# Patient Record
Sex: Male | Born: 1955 | Race: White | Hispanic: No | Marital: Married | Smoking: Never smoker
Health system: Southern US, Community
[De-identification: ages and names within clinical notes are randomized; demographics above are authoritative.]

## PROBLEM LIST (undated history)

## (undated) DIAGNOSIS — E785 Hyperlipidemia, unspecified: Secondary | ICD-10-CM

## (undated) DIAGNOSIS — E669 Obesity, unspecified: Secondary | ICD-10-CM

## (undated) DIAGNOSIS — K649 Unspecified hemorrhoids: Secondary | ICD-10-CM

## (undated) DIAGNOSIS — K219 Gastro-esophageal reflux disease without esophagitis: Secondary | ICD-10-CM

## (undated) DIAGNOSIS — M1990 Unspecified osteoarthritis, unspecified site: Secondary | ICD-10-CM

## (undated) DIAGNOSIS — J309 Allergic rhinitis, unspecified: Secondary | ICD-10-CM

## (undated) DIAGNOSIS — M545 Low back pain: Secondary | ICD-10-CM

## (undated) DIAGNOSIS — R079 Chest pain, unspecified: Secondary | ICD-10-CM

## (undated) HISTORY — DX: Unspecified osteoarthritis, unspecified site: M19.90

## (undated) HISTORY — PX: OTHER SURGICAL HISTORY: SHX169

## (undated) HISTORY — DX: Unspecified hemorrhoids: K64.9

## (undated) HISTORY — PX: VASECTOMY: SHX75

## (undated) HISTORY — DX: Allergic rhinitis, unspecified: J30.9

## (undated) HISTORY — DX: Chest pain, unspecified: R07.9

## (undated) HISTORY — DX: Gastro-esophageal reflux disease without esophagitis: K21.9

## (undated) HISTORY — DX: Low back pain: M54.5

## (undated) HISTORY — DX: Hyperlipidemia, unspecified: E78.5

## (undated) HISTORY — DX: Obesity, unspecified: E66.9

---

## 2001-08-08 ENCOUNTER — Encounter: Admission: RE | Admit: 2001-08-08 | Discharge: 2001-08-08 | Payer: Self-pay | Admitting: *Deleted

## 2001-08-08 ENCOUNTER — Encounter: Payer: Self-pay | Admitting: *Deleted

## 2001-08-29 ENCOUNTER — Encounter: Admission: RE | Admit: 2001-08-29 | Discharge: 2001-08-29 | Payer: Self-pay | Admitting: *Deleted

## 2001-08-29 ENCOUNTER — Encounter: Payer: Self-pay | Admitting: *Deleted

## 2002-03-02 ENCOUNTER — Encounter: Admission: RE | Admit: 2002-03-02 | Discharge: 2002-03-02 | Payer: Self-pay | Admitting: *Deleted

## 2002-03-02 ENCOUNTER — Encounter: Payer: Self-pay | Admitting: *Deleted

## 2003-11-28 ENCOUNTER — Encounter: Admission: RE | Admit: 2003-11-28 | Discharge: 2003-11-28 | Payer: Self-pay | Admitting: Internal Medicine

## 2003-12-09 ENCOUNTER — Ambulatory Visit (HOSPITAL_COMMUNITY): Admission: RE | Admit: 2003-12-09 | Discharge: 2003-12-09 | Payer: Self-pay | Admitting: Orthopedic Surgery

## 2003-12-09 ENCOUNTER — Ambulatory Visit (HOSPITAL_BASED_OUTPATIENT_CLINIC_OR_DEPARTMENT_OTHER): Admission: RE | Admit: 2003-12-09 | Discharge: 2003-12-09 | Payer: Self-pay | Admitting: Orthopedic Surgery

## 2004-05-05 ENCOUNTER — Ambulatory Visit: Payer: Self-pay | Admitting: Internal Medicine

## 2004-05-16 ENCOUNTER — Ambulatory Visit (HOSPITAL_COMMUNITY): Admission: RE | Admit: 2004-05-16 | Discharge: 2004-05-16 | Payer: Self-pay | Admitting: Internal Medicine

## 2004-06-13 ENCOUNTER — Ambulatory Visit: Payer: Self-pay | Admitting: Internal Medicine

## 2004-06-14 ENCOUNTER — Encounter: Admission: RE | Admit: 2004-06-14 | Discharge: 2004-06-14 | Payer: Self-pay | Admitting: Internal Medicine

## 2005-01-05 ENCOUNTER — Ambulatory Visit: Payer: Self-pay | Admitting: Internal Medicine

## 2005-01-12 ENCOUNTER — Ambulatory Visit: Payer: Self-pay | Admitting: Internal Medicine

## 2005-01-17 ENCOUNTER — Ambulatory Visit: Payer: Self-pay | Admitting: Internal Medicine

## 2005-04-06 ENCOUNTER — Ambulatory Visit: Payer: Self-pay | Admitting: Internal Medicine

## 2005-04-09 ENCOUNTER — Ambulatory Visit: Payer: Self-pay | Admitting: Internal Medicine

## 2005-06-01 ENCOUNTER — Ambulatory Visit: Payer: Self-pay | Admitting: Internal Medicine

## 2005-06-12 ENCOUNTER — Ambulatory Visit: Payer: Self-pay | Admitting: Internal Medicine

## 2005-06-15 ENCOUNTER — Ambulatory Visit: Payer: Self-pay | Admitting: Internal Medicine

## 2005-10-19 ENCOUNTER — Ambulatory Visit: Payer: Self-pay | Admitting: Internal Medicine

## 2006-07-12 ENCOUNTER — Ambulatory Visit (HOSPITAL_BASED_OUTPATIENT_CLINIC_OR_DEPARTMENT_OTHER): Admission: RE | Admit: 2006-07-12 | Discharge: 2006-07-12 | Payer: Self-pay | Admitting: Specialist

## 2006-11-22 ENCOUNTER — Ambulatory Visit: Payer: Self-pay | Admitting: Internal Medicine

## 2006-11-22 LAB — CONVERTED CEMR LAB
ALT: 70 units/L — ABNORMAL HIGH (ref 0–53)
Albumin: 3.8 g/dL (ref 3.5–5.2)
Alkaline Phosphatase: 70 units/L (ref 39–117)
BUN: 12 mg/dL (ref 6–23)
Bilirubin Urine: NEGATIVE
Bilirubin, Direct: 0.1 mg/dL (ref 0.0–0.3)
Calcium: 8.9 mg/dL (ref 8.4–10.5)
Chloride: 109 meq/L (ref 96–112)
Creatinine, Ser: 0.9 mg/dL (ref 0.4–1.5)
GFR calc non Af Amer: 95 mL/min
HDL: 41.5 mg/dL (ref >39.0)
Hemoglobin: 14 g/dL (ref 13.0–17.0)
Lymphocytes Relative: 33.8 % (ref 12.0–46.0)
MCV: 92.7 fL (ref 78.0–100.0)
Neutro Abs: 4 10*3/uL (ref 1.4–7.7)
Nitrite: NEGATIVE
Platelets: 325 10*3/uL (ref 150–400)
Sodium: 141 meq/L (ref 135–145)
Specific Gravity, Urine: 1.02 (ref 1.000–1.03)
TSH: 2.13 microintl units/mL (ref 0.35–5.50)
Total Bilirubin: 0.9 mg/dL (ref 0.3–1.2)
Total CHOL/HDL Ratio: 5.8
Total Protein, Urine: NEGATIVE mg/dL
Total Protein: 6.3 g/dL (ref 6.0–8.3)
Urobilinogen, UA: 0.2 (ref 0.0–1.0)
VLDL: 18 mg/dL (ref 0–40)

## 2006-12-02 ENCOUNTER — Ambulatory Visit: Payer: Self-pay | Admitting: Internal Medicine

## 2006-12-09 ENCOUNTER — Encounter: Admission: RE | Admit: 2006-12-09 | Discharge: 2006-12-09 | Payer: Self-pay | Admitting: Internal Medicine

## 2006-12-28 ENCOUNTER — Encounter: Payer: Self-pay | Admitting: Internal Medicine

## 2006-12-28 DIAGNOSIS — E669 Obesity, unspecified: Secondary | ICD-10-CM

## 2006-12-28 DIAGNOSIS — K649 Unspecified hemorrhoids: Secondary | ICD-10-CM | POA: Insufficient documentation

## 2006-12-28 HISTORY — DX: Unspecified hemorrhoids: K64.9

## 2006-12-28 HISTORY — DX: Obesity, unspecified: E66.9

## 2006-12-30 DIAGNOSIS — E785 Hyperlipidemia, unspecified: Secondary | ICD-10-CM | POA: Insufficient documentation

## 2006-12-30 DIAGNOSIS — M545 Low back pain, unspecified: Secondary | ICD-10-CM

## 2006-12-30 DIAGNOSIS — K219 Gastro-esophageal reflux disease without esophagitis: Secondary | ICD-10-CM | POA: Insufficient documentation

## 2006-12-30 DIAGNOSIS — J309 Allergic rhinitis, unspecified: Secondary | ICD-10-CM | POA: Insufficient documentation

## 2006-12-30 DIAGNOSIS — M1990 Unspecified osteoarthritis, unspecified site: Secondary | ICD-10-CM

## 2006-12-30 HISTORY — DX: Allergic rhinitis, unspecified: J30.9

## 2006-12-30 HISTORY — DX: Low back pain, unspecified: M54.50

## 2006-12-30 HISTORY — DX: Unspecified osteoarthritis, unspecified site: M19.90

## 2006-12-30 HISTORY — DX: Hyperlipidemia, unspecified: E78.5

## 2006-12-30 HISTORY — DX: Gastro-esophageal reflux disease without esophagitis: K21.9

## 2007-01-23 ENCOUNTER — Ambulatory Visit: Payer: Self-pay | Admitting: Internal Medicine

## 2007-01-23 LAB — CONVERTED CEMR LAB
ALT: 40 units/L (ref 0–53)
Albumin: 3.7 g/dL (ref 3.5–5.2)
Alkaline Phosphatase: 70 units/L (ref 39–117)
Hep A IgM: NEGATIVE
Hep B C IgM: NEGATIVE
Hepatitis B Surface Ag: NEGATIVE
LDL Cholesterol: 91 mg/dL (ref 0–99)
Total CHOL/HDL Ratio: 3.6
Triglycerides: 99 mg/dL (ref 0–149)
VLDL: 20 mg/dL (ref 0–40)

## 2007-04-04 ENCOUNTER — Ambulatory Visit (HOSPITAL_BASED_OUTPATIENT_CLINIC_OR_DEPARTMENT_OTHER): Admission: RE | Admit: 2007-04-04 | Discharge: 2007-04-04 | Payer: Self-pay | Admitting: Specialist

## 2007-07-11 ENCOUNTER — Telehealth: Payer: Self-pay | Admitting: Internal Medicine

## 2007-07-21 ENCOUNTER — Ambulatory Visit: Payer: Self-pay | Admitting: Internal Medicine

## 2007-07-21 LAB — CONVERTED CEMR LAB
ALT: 37 units/L (ref 0–53)
AST: 26 units/L (ref 0–37)
Albumin: 3.7 g/dL (ref 3.5–5.2)
Alkaline Phosphatase: 79 units/L (ref 39–117)
LDL Cholesterol: 66 mg/dL (ref 0–99)
VLDL: 12 mg/dL (ref 0–40)

## 2007-09-01 ENCOUNTER — Encounter: Payer: Self-pay | Admitting: Internal Medicine

## 2007-09-10 ENCOUNTER — Encounter: Payer: Self-pay | Admitting: Internal Medicine

## 2007-09-11 ENCOUNTER — Ambulatory Visit: Payer: Self-pay | Admitting: Internal Medicine

## 2007-09-11 DIAGNOSIS — R079 Chest pain, unspecified: Secondary | ICD-10-CM | POA: Insufficient documentation

## 2007-09-11 HISTORY — DX: Chest pain, unspecified: R07.9

## 2007-09-25 ENCOUNTER — Ambulatory Visit: Payer: Self-pay

## 2007-09-25 ENCOUNTER — Encounter: Payer: Self-pay | Admitting: Internal Medicine

## 2008-01-27 ENCOUNTER — Telehealth (INDEPENDENT_AMBULATORY_CARE_PROVIDER_SITE_OTHER): Payer: Self-pay | Admitting: *Deleted

## 2008-03-11 ENCOUNTER — Telehealth (INDEPENDENT_AMBULATORY_CARE_PROVIDER_SITE_OTHER): Payer: Self-pay | Admitting: *Deleted

## 2008-03-15 ENCOUNTER — Encounter: Payer: Self-pay | Admitting: Internal Medicine

## 2008-06-07 ENCOUNTER — Telehealth (INDEPENDENT_AMBULATORY_CARE_PROVIDER_SITE_OTHER): Payer: Self-pay | Admitting: *Deleted

## 2008-09-22 ENCOUNTER — Telehealth (INDEPENDENT_AMBULATORY_CARE_PROVIDER_SITE_OTHER): Payer: Self-pay | Admitting: *Deleted

## 2008-10-26 ENCOUNTER — Ambulatory Visit: Payer: Self-pay | Admitting: Internal Medicine

## 2008-10-26 LAB — CONVERTED CEMR LAB
ALT: 29 units/L (ref 0–53)
AST: 21 units/L (ref 0–37)
Albumin: 4 g/dL (ref 3.5–5.2)
Alkaline Phosphatase: 72 units/L (ref 39–117)
BUN: 18 mg/dL (ref 6–23)
Basophils Absolute: 0 10*3/uL (ref 0.0–0.1)
Basophils Relative: 0.5 % (ref 0.0–3.0)
Bilirubin Urine: NEGATIVE
Bilirubin, Direct: 0.1 mg/dL (ref 0.0–0.3)
CO2: 28 meq/L (ref 19–32)
Calcium: 8.7 mg/dL (ref 8.4–10.5)
Chloride: 108 meq/L (ref 96–112)
Cholesterol: 124 mg/dL (ref 0–200)
Creatinine, Ser: 0.8 mg/dL (ref 0.4–1.5)
Eosinophils Absolute: 0.2 10*3/uL (ref 0.0–0.7)
Eosinophils Relative: 1.9 % (ref 0.0–5.0)
GFR calc non Af Amer: 108.25 mL/min (ref >60)
Glucose, Bld: 94 mg/dL (ref 70–99)
HCT: 39.8 % (ref 39.0–52.0)
HDL: 47.9 mg/dL (ref >39.00)
Hemoglobin: 13.6 g/dL (ref 13.0–17.0)
Ketones, ur: NEGATIVE mg/dL
LDL Cholesterol: 63 mg/dL (ref 0–99)
Leukocytes, UA: NEGATIVE
Lymphocytes Relative: 38.4 % (ref 12.0–46.0)
Lymphs Abs: 3.5 10*3/uL (ref 0.7–4.0)
MCHC: 34.2 g/dL (ref 30.0–36.0)
MCV: 94.2 fL (ref 78.0–100.0)
Monocytes Absolute: 0.8 10*3/uL (ref 0.1–1.0)
Monocytes Relative: 9 % (ref 3.0–12.0)
Neutro Abs: 4.6 10*3/uL (ref 1.4–7.7)
Neutrophils Relative %: 50.2 % (ref 43.0–77.0)
Nitrite: NEGATIVE
PSA: 0.46 ng/mL (ref 0.10–4.00)
Platelets: 270 10*3/uL (ref 150.0–400.0)
Potassium: 4.2 meq/L (ref 3.5–5.1)
RBC: 4.22 M/uL (ref 4.22–5.81)
RDW: 12.4 % (ref 11.5–14.6)
Sodium: 144 meq/L (ref 135–145)
Specific Gravity, Urine: >=1.03 (ref 1.000–1.030)
TSH: 3.66 microintl units/mL (ref 0.35–5.50)
Total Bilirubin: 0.8 mg/dL (ref 0.3–1.2)
Total CHOL/HDL Ratio: 3
Total Protein, Urine: NEGATIVE mg/dL
Total Protein: 6.6 g/dL (ref 6.0–8.3)
Triglycerides: 66 mg/dL (ref 0.0–149.0)
Urine Glucose: NEGATIVE mg/dL
Urobilinogen, UA: 0.2 (ref 0.0–1.0)
VLDL: 13.2 mg/dL (ref 0.0–40.0)
WBC: 9.1 10*3/uL (ref 4.5–10.5)
pH: 6 (ref 5.0–8.0)

## 2008-10-28 ENCOUNTER — Ambulatory Visit: Payer: Self-pay | Admitting: Internal Medicine

## 2008-10-29 ENCOUNTER — Telehealth (INDEPENDENT_AMBULATORY_CARE_PROVIDER_SITE_OTHER): Payer: Self-pay | Admitting: *Deleted

## 2009-05-06 ENCOUNTER — Telehealth: Payer: Self-pay | Admitting: Internal Medicine

## 2009-05-09 ENCOUNTER — Encounter (INDEPENDENT_AMBULATORY_CARE_PROVIDER_SITE_OTHER): Payer: Self-pay | Admitting: *Deleted

## 2009-05-11 ENCOUNTER — Encounter: Payer: Self-pay | Admitting: Internal Medicine

## 2009-07-15 IMAGING — US US ABDOMEN COMPLETE
1 series · 14 of 25 positions shown · non-contrast
Comparison: none

CLINICAL DATA: Elevated LFTs.
 ABDOMEN ULTRASOUND:
TECHNIQUE: Complete abdominal ultrasound examination was performed including evaluation of the liver, gallbladder, bile ducts, pancreas, kidneys, spleen, IVC, and abdominal aorta.
 The gallbladder is contracted but no gallstones are noted.  The liver is slightly echogenic suggesting mild fatty infiltration. The common bile duct is normal measuring 3.9 mm in diameter.  The IVC, pancreas, and spleen are normal although the pancreas is slightly echogenic.  No hydronephrosis is noted.  The right kidney measures 11.5 cm sagittally with the left kidney measuring 11.2 cm.  Two small hypoechoic areas are noted on the right, most likely small cysts of less than 1 cm.  The abdominal aorta is normal in caliber.

[Series 1: us abdomen complete · 0.35mm/px · 14 of 90 slices shown]
[im 1/90]
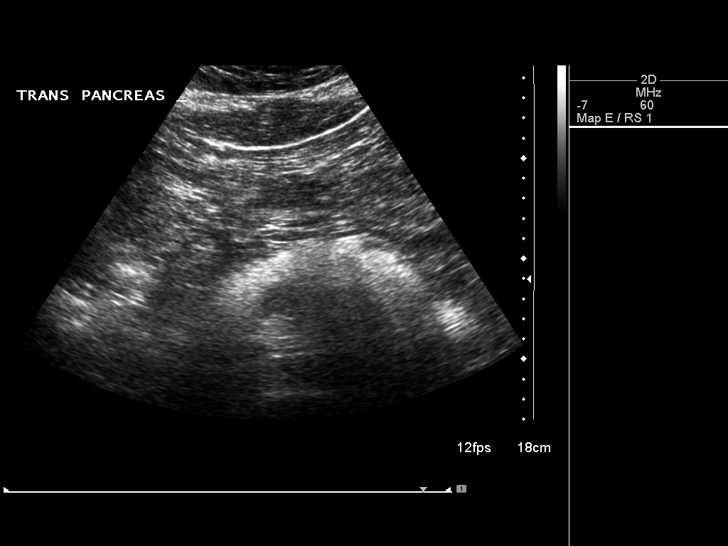
[im 8/90]
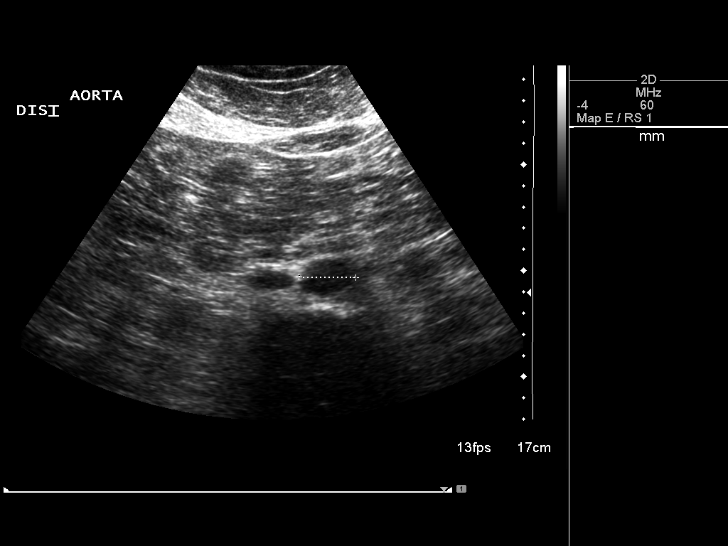
[im 15/90]
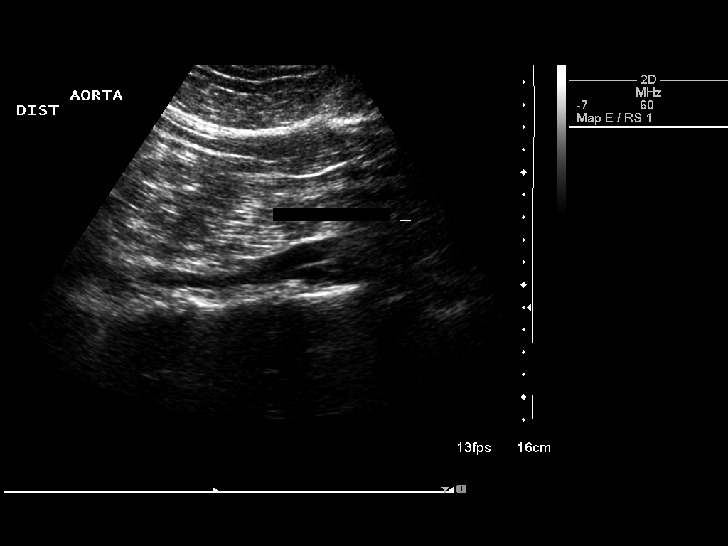
[im 23/90]
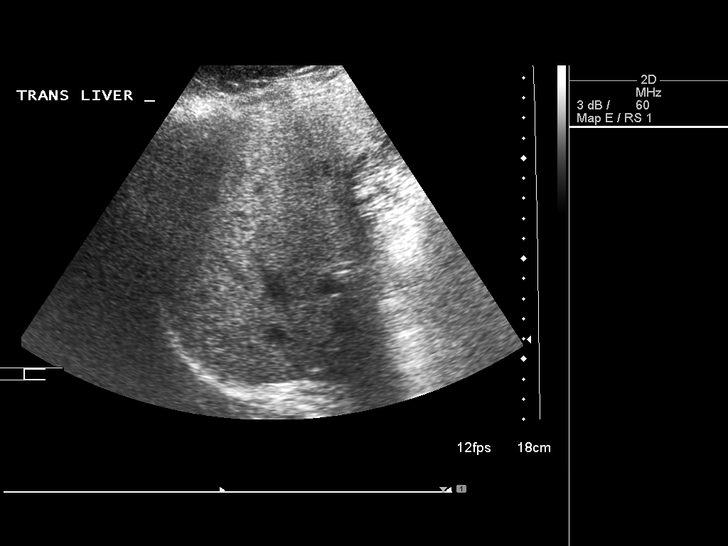
[im 30/90]
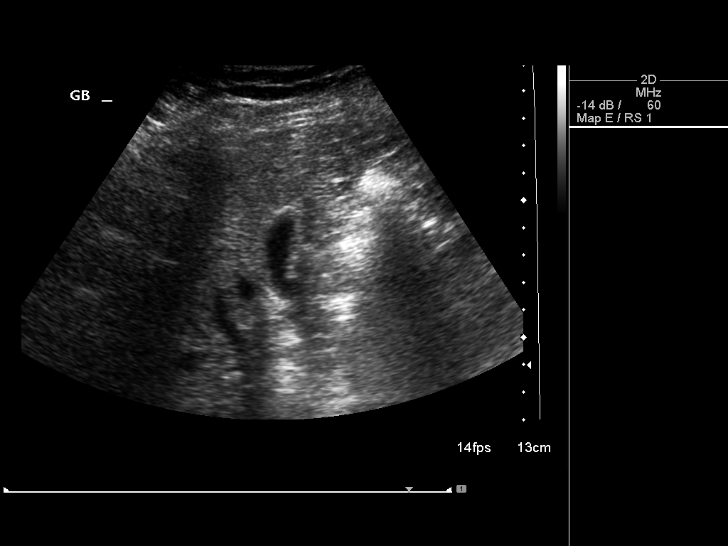
[im 34/90]
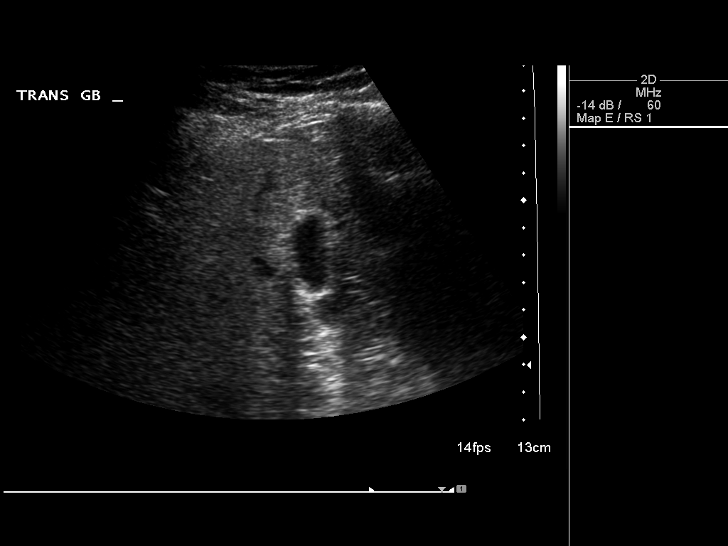
[im 41/90]
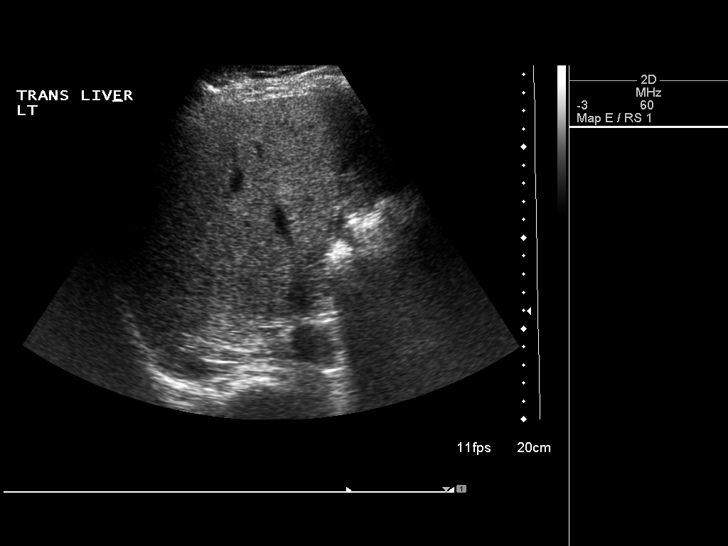
[im 49/90]
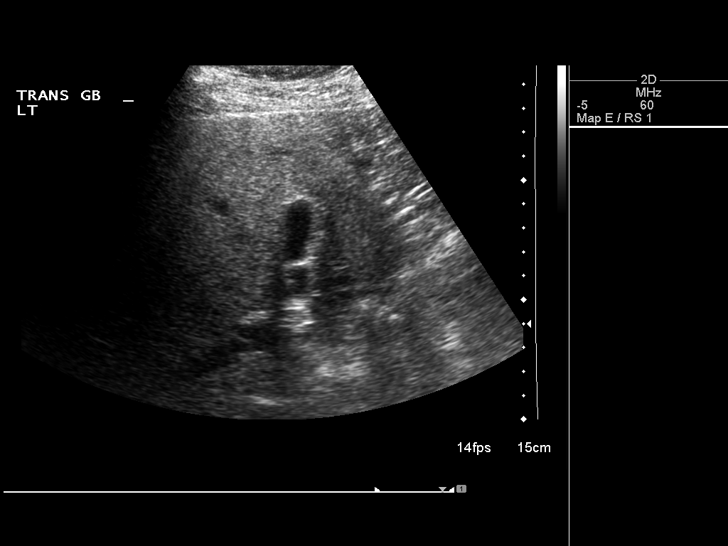
[im 56/90]
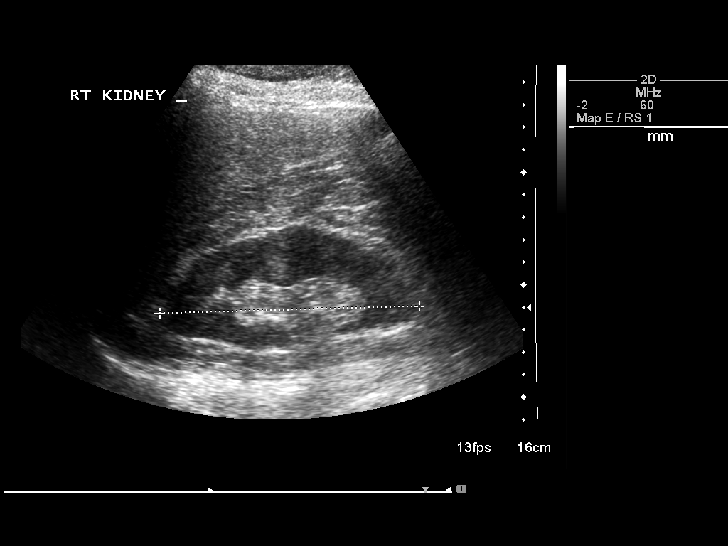
[im 60/90]
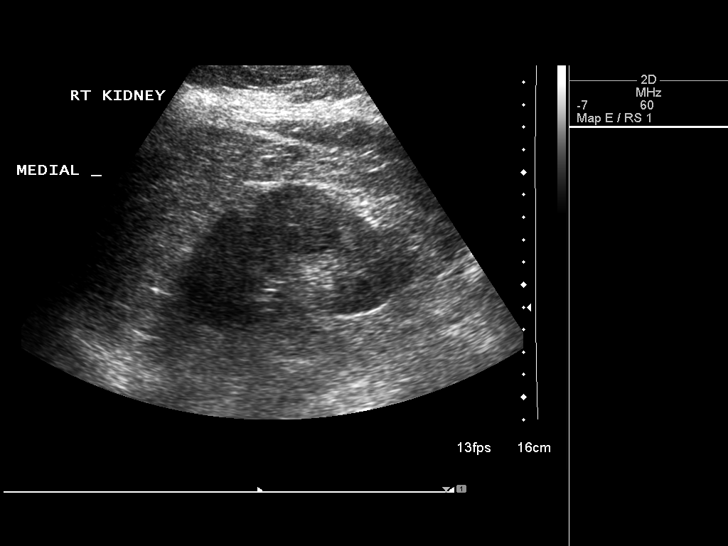
[im 67/90]
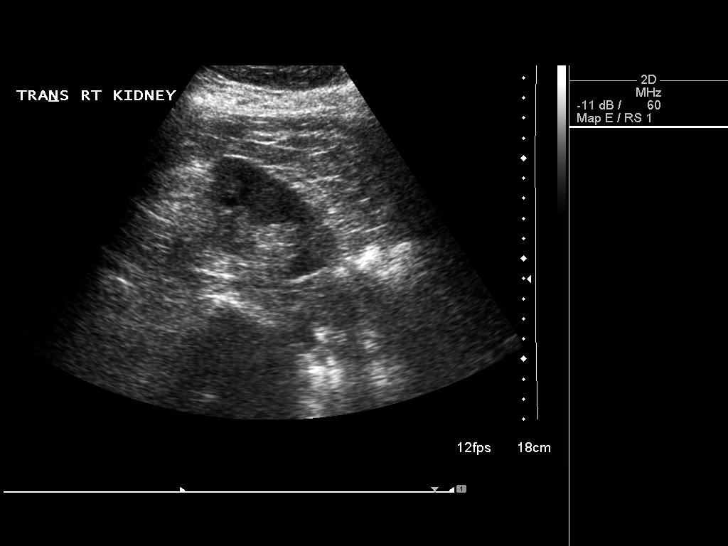
[im 75/90]
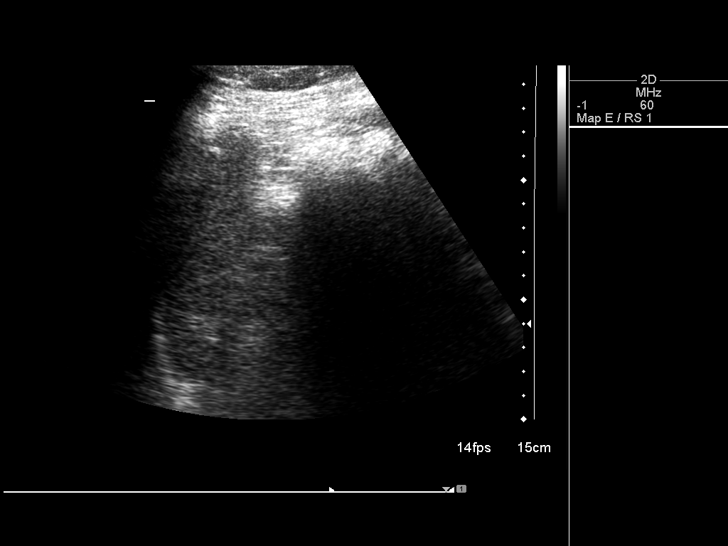
[im 82/90]
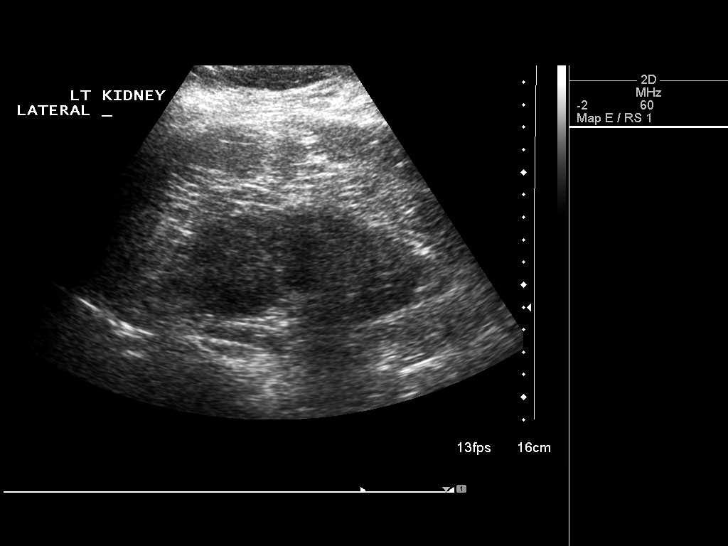
[im 90/90]
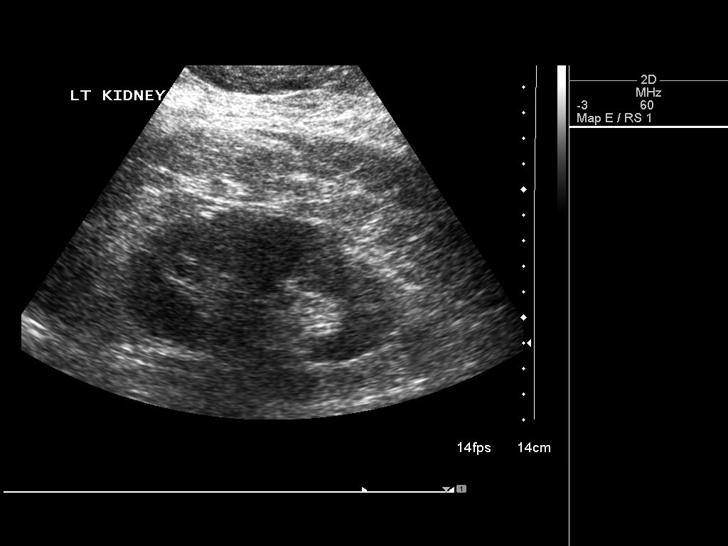

[14 of 25 positions shown; findings below may reference images not displayed]

IMPRESSION: 1.  No gallstones.  Contracted gallbladder.
 2.  Slightly echogenic liver.  Probable fatty infiltration.

## 2009-11-07 ENCOUNTER — Ambulatory Visit: Payer: Self-pay | Admitting: Internal Medicine

## 2009-11-07 LAB — CONVERTED CEMR LAB
AST: 21 units/L (ref 0–37)
Albumin: 4 g/dL (ref 3.5–5.2)
BUN: 21 mg/dL (ref 6–23)
Basophils Relative: 0.3 % (ref 0.0–3.0)
Cholesterol: 147 mg/dL (ref 0–200)
Creatinine, Ser: 0.8 mg/dL (ref 0.4–1.5)
Eosinophils Relative: 2.6 % (ref 0.0–5.0)
GFR calc non Af Amer: 106.28 mL/min (ref >60)
Glucose, Bld: 89 mg/dL (ref 70–99)
HCT: 41.4 % (ref 39.0–52.0)
Hemoglobin: 13.9 g/dL (ref 13.0–17.0)
Ketones, ur: NEGATIVE mg/dL
Leukocytes, UA: NEGATIVE
Lymphs Abs: 2.8 10*3/uL (ref 0.7–4.0)
MCHC: 33.5 g/dL (ref 30.0–36.0)
MCV: 96.1 fL (ref 78.0–100.0)
Monocytes Absolute: 0.9 10*3/uL (ref 0.1–1.0)
Neutro Abs: 4.8 10*3/uL (ref 1.4–7.7)
Nitrite: NEGATIVE
PSA: 0.52 ng/mL (ref 0.10–4.00)
Potassium: 5 meq/L (ref 3.5–5.1)
RBC: 4.31 M/uL (ref 4.22–5.81)
Specific Gravity, Urine: >=1.03 (ref 1.000–1.030)
Triglycerides: 176 mg/dL — ABNORMAL HIGH (ref 0.0–149.0)
VLDL: 35.2 mg/dL (ref 0.0–40.0)
WBC: 8.7 10*3/uL (ref 4.5–10.5)
pH: 6 (ref 5.0–8.0)

## 2010-04-17 IMAGING — CR DG CHEST 2V
2 series · 2 of 2 positions shown · non-contrast
Comparison: Chest x-ray of 06/14/2004

CLINICAL DATA: Chest pain for a month

CHEST - 2 VIEW

[view not recorded (1 of 2)]
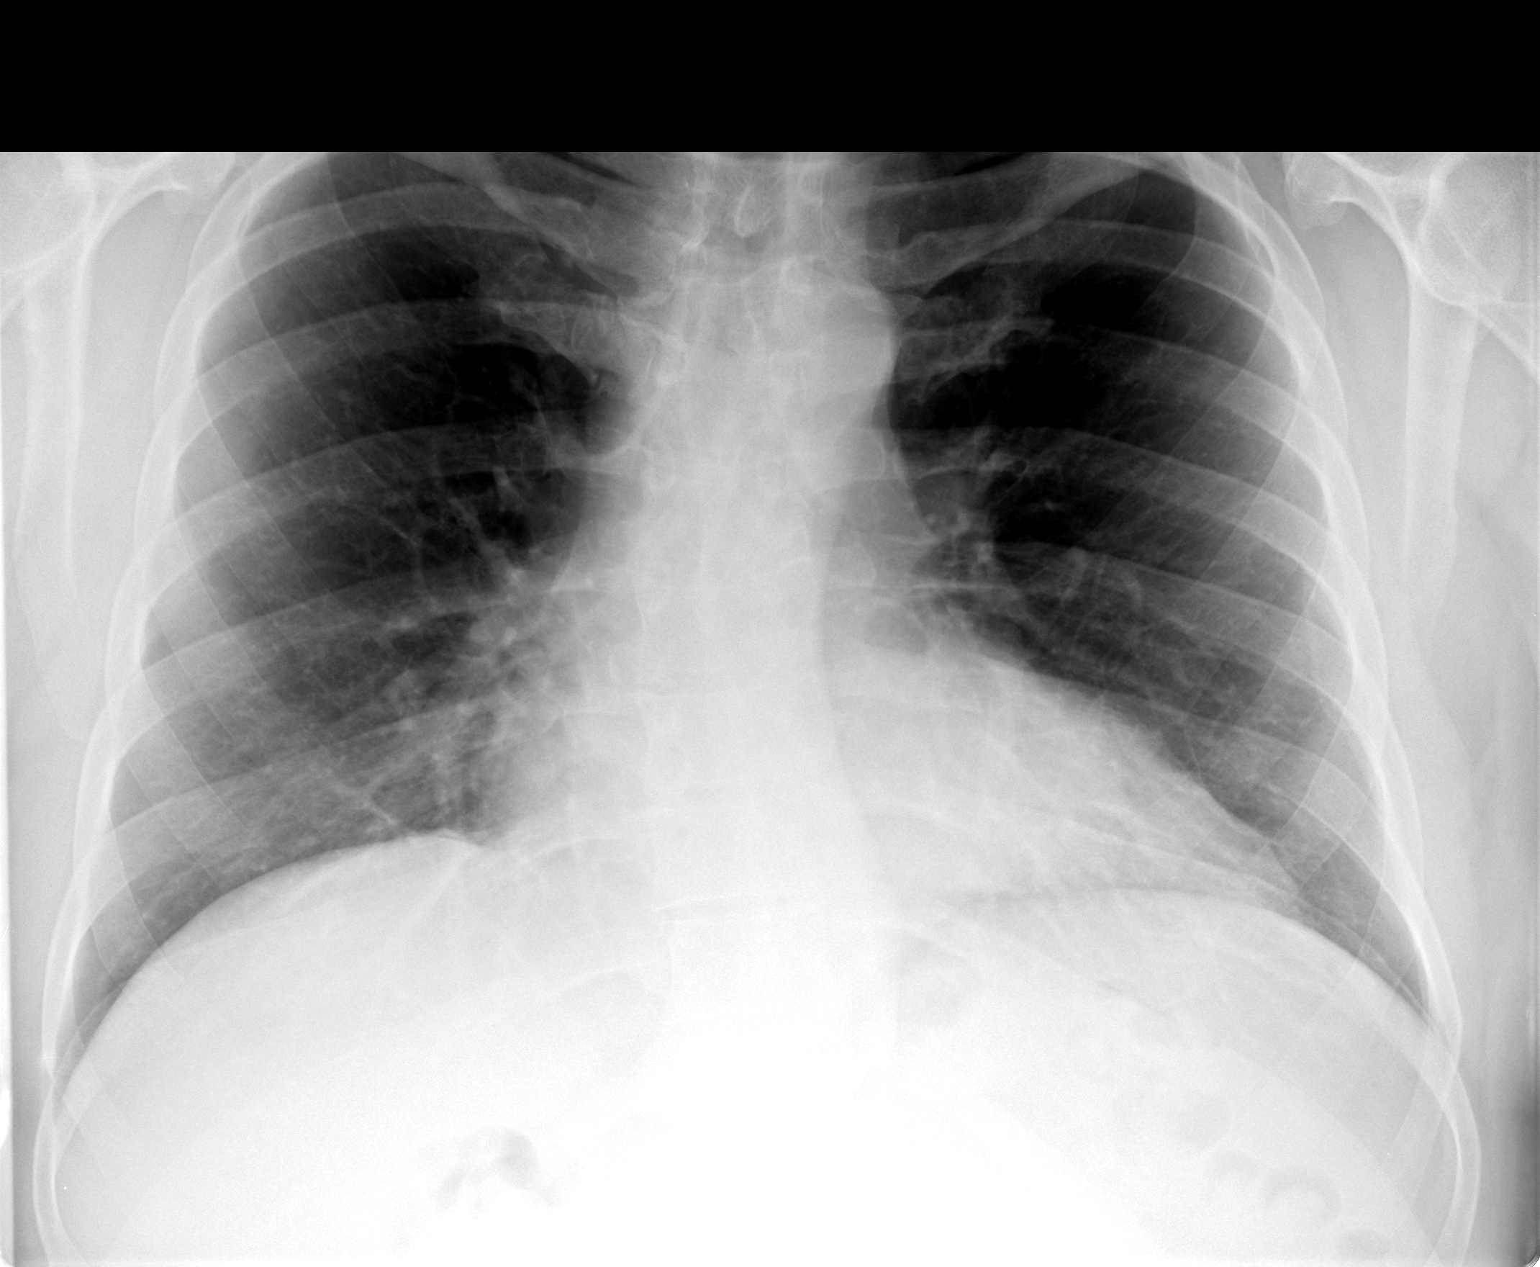

[view not recorded (2 of 2)]
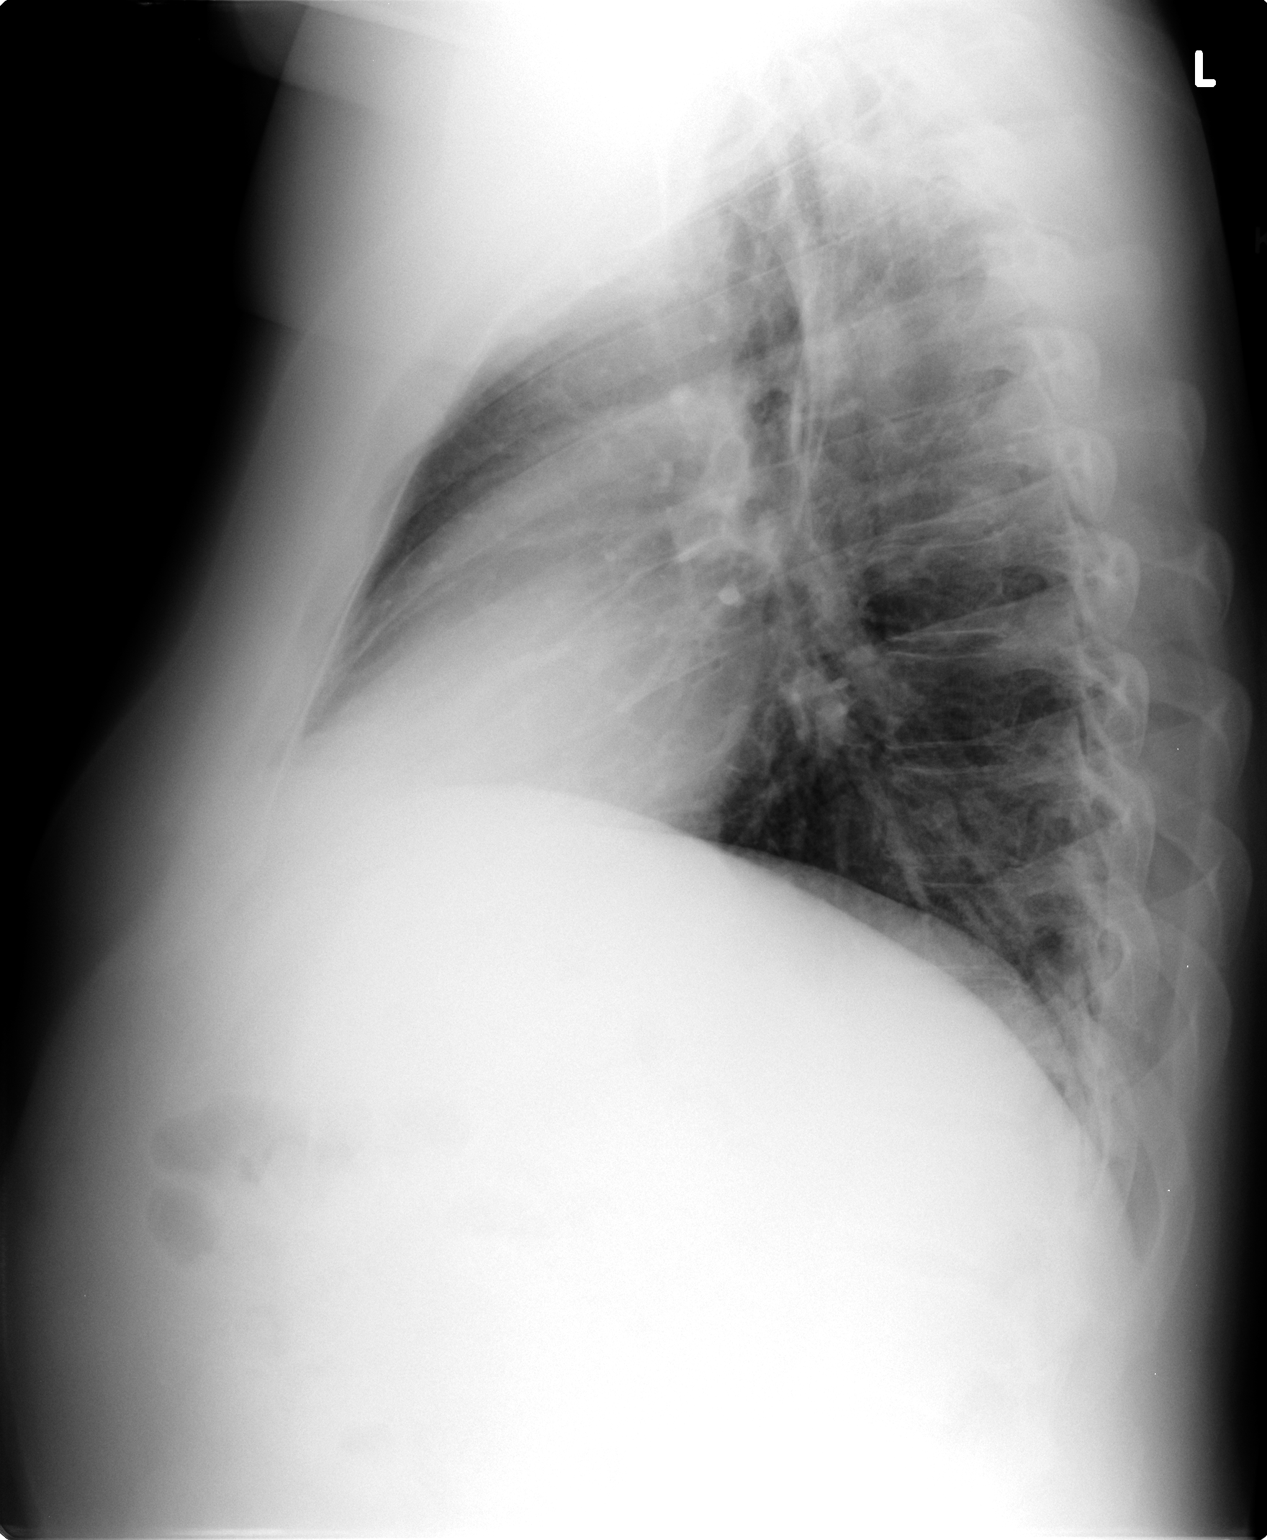

[2 of 2 positions shown; findings below may reference images not displayed]

FINDINGS: The lungs are clear.  Heart is upper limits of normal and
stable.  No bony abnormality is seen.
IMPRESSION: Stable chest x-ray.  No active lung disease.

## 2010-05-20 ENCOUNTER — Encounter: Payer: Self-pay | Admitting: Orthopedic Surgery

## 2010-05-21 ENCOUNTER — Encounter: Payer: Self-pay | Admitting: Internal Medicine

## 2010-05-24 ENCOUNTER — Telehealth: Payer: Self-pay | Admitting: Internal Medicine

## 2010-06-01 NOTE — Letter (Signed)
Summary: [facility] Referral (unable to schedule) Notification  [facility]  [address] [place], [place] [address]   Phone: [phone]  Fax: [phone]      May 09, 2009 Barry Carter 1955-05-02 MRN: [id number]   Barry Carter [address] [place], [place]  [address]   Dear Dr. Jonny Ruiz:   Thank you for your kind referral of the above patient. We have attempted to schedule the recommended Colonoscopy but have been unable to schedule because:  _x_ The patient was not available by phone and/or has not returned our calls.  __ The patient declined to schedule the procedure at this time.  We appreciate the referral and hope that we will have the opportunity to treat this patient in the future.    Sincerely,   [facility]  Vania Rea. Jarold Motto M.D. Hedwig Morton. Juanda Chance M.D. Venita Lick. Russella Dar M.D. Wilhemina Bonito. Marina Goodell M.D. Barbette Hair. Arlyce Dice M.D. Iva Boop M.D. Cheron Every.D.

## 2010-06-01 NOTE — Assessment & Plan Note (Signed)
Summary: CPX/NWS   Vital Signs:  Patient profile:   55 year old male Height:      76 inches Weight:      261 pounds BMI:     31.88 O2 Sat:      98 % on Room air Temp:     98.6 degrees F oral Pulse rate:   62 / minute BP sitting:   128 / 80  (left arm) Cuff size:   large  Vitals Entered By: Zella Ball Ewing CMA Duncan Dull) (November 07, 2009 8:32 AM)  O2 Flow:  Room air  CC: Adult physical/RE   CC:  Adult physical/RE.  History of Present Illness: overall doing ok, Pt denies CP, sob, doe, wheezing, orthopnea, pnd, worsening LE edema, palps, dizziness or syncope  Pt denies new neuro symptoms such as headache, facial or extremity weakness   No fever, wt loss, night sweats, loss of appetite or other constitutional symptoms   Preventive Screening-Counseling & Management  Alcohol-Tobacco     Smoking Status: never      Drug Use:  no.    Problems Prior to Update: 1)  Preventive Health Care  (ICD-V70.0) 2)  Chest Pain  (ICD-786.50) 3)  Osteoarthritis  (ICD-715.90) 4)  Low Back Pain  (ICD-724.2) 5)  Hyperlipidemia  (ICD-272.4) 6)  Gerd  (ICD-530.81) 7)  Allergic Rhinitis  (ICD-477.9) 8)  Obesity  (ICD-278.00) 9)  Hemorrhoids  (ICD-455.6)  Medications Prior to Update: 1)  Omeprazole 20 Mg Cpdr (Omeprazole) .... 2 By Mouth Once Daily 2)  Lipitor 80 Mg  Tabs (Atorvastatin Calcium) .... 1/2 By Mouth Once Daily 3)  Ecotrin Low Strength 81 Mg  Tbec (Aspirin) .Marland Kitchen.. 1 By Mouth Qd 4)  Vicodin Es 7.5-750 Mg  Tabs (Hydrocodone-Acetaminophen) .Marland Kitchen.. 1 By Mouth Once Daily As Needed 5)  Zonalon 5 % Crea (Doxepin Hcl (Antipruritic)) .... Use Asd As Needed  Current Medications (verified): 1)  Omeprazole 20 Mg Cpdr (Omeprazole) .... 2 By Mouth Once Daily 2)  Lipitor 80 Mg  Tabs (Atorvastatin Calcium) .... 1/2 By Mouth Once Daily 3)  Ecotrin Low Strength 81 Mg  Tbec (Aspirin) .Marland Kitchen.. 1 By Mouth Qd 4)  Vicodin Es 7.5-750 Mg  Tabs (Hydrocodone-Acetaminophen) .Marland Kitchen.. 1 By Mouth Once Daily As Needed  Allergies  (verified): No Known Drug Allergies  Past History:  Past Medical History: Last updated: 09/11/2007 Hemorrhoids Obesity Allergic rhinitis GERD Hyperlipidemia Low back pain Osteoarthritis-  Knee  Past Surgical History: Last updated: 09/11/2007 Vasectomy s/p right knee surgury  Family History: Last updated: 09/11/2007 HTN DM heart disease  Social History: Last updated: 11/07/2009 Married 2 children lives now in [place] Drug use-no Alcohol use-no Never Smoked  Risk Factors: Smoking Status: never (11/07/2009)  Social History: Reviewed history from 10/28/2008 and no changes required. Married 2 children lives now in [place] Drug use-no Alcohol use-no Never Smoked Drug Use:  no  Review of Systems  The patient denies anorexia, fever, weight loss, weight gain, vision loss, decreased hearing, hoarseness, chest pain, syncope, dyspnea on exertion, peripheral edema, prolonged cough, headaches, hemoptysis, abdominal pain, melena, hematochezia, severe indigestion/heartburn, hematuria, muscle weakness, suspicious skin lesions, transient blindness, difficulty walking, depression, unusual weight change, abnormal bleeding, enlarged lymph nodes, and angioedema.         all otherwise negative per pt -    Physical Exam  General:  alert and overweight-appearing.   Head:  normocephalic and atraumatic.   Eyes:  vision grossly intact, pupils equal, and pupils round.   Ears:  R  ear normal and L ear normal.   Nose:  no external deformity and no nasal discharge.   Mouth:  good dentition, no gingival abnormalities, and pharynx pink and moist.   Neck:  supple and no masses.   Lungs:  normal respiratory effort and normal breath sounds.   Heart:  normal rate and regular rhythm.   Abdomen:  soft, non-tender, and normal bowel sounds.   Msk:  no joint tenderness and no joint swelling.   Extremities:  no edema, no erythema  Neurologic:  cranial nerves II-XII intact and  strength normal in all extremities.   Skin:  color normal and no rashes.   Psych:  not anxious appearing and not depressed appearing.     Impression & Recommendations:  Problem # 1:  Preventive Health Care (ICD-V70.0)  Overall doing well, age appropriate education and counseling updated and referral for appropriate preventive services done unless declined, immunizations up to date or declined, diet counseling done if overweight, urged to quit smoking if smokes , most recent labs reviewed and current ordered if appropriate, ecg reviewed or declined (interpretation per ECG scanned in the EMR if done); information regarding Medicare Prevention requirements given if appropriate; speciality referrals updated as appropriate   Orders: EKG w/ Interpretation (93000)  Complete Medication List: 1)  Omeprazole 20 Mg Cpdr (Omeprazole) .... 2 by mouth once daily 2)  Lipitor 80 Mg Tabs (Atorvastatin calcium) .... 1/2 by mouth once daily 3)  Ecotrin Low Strength 81 Mg Tbec (Aspirin) .Marland Kitchen.. 1 by mouth qd 4)  Vicodin Es 7.5-750 Mg Tabs (Hydrocodone-acetaminophen) .Marland Kitchen.. 1 by mouth once daily as needed  Patient Instructions: 1)  You will be contacted about the referral(s) to: Gi in [place] 2)  please call the number on the blue card for test results 3)  Continue all previous medications as before this visit  4)  Please schedule a follow-up appointment in 1 year or sooner if needed Prescriptions: VICODIN ES 7.5-750 MG  TABS (HYDROCODONE-ACETAMINOPHEN) 1 by mouth once daily as needed  #30 x 5   Entered and Authorized by:   Corwin Levins MD   Signed by:   Corwin Levins MD on 11/07/2009   Method used:   Print then Give to Patient   RxID:   [id number] LIPITOR 80 MG  TABS (ATORVASTATIN CALCIUM) 1 by mouth once daily  #90 x 3   Entered and Authorized by:   Corwin Levins MD   Signed by:   Corwin Levins MD on 11/07/2009   Method used:   Print then Give to Patient   RxID:   [id number] OMEPRAZOLE 20 MG  CPDR (OMEPRAZOLE) 2 by mouth once daily  #180 x 3   Entered and Authorized by:   Corwin Levins MD   Signed by:   Corwin Levins MD on 11/07/2009   Method used:   Print then Give to Patient   RxID:   [id number]

## 2010-06-01 NOTE — Progress Notes (Signed)
Summary: Medication refill  Phone Note Refill Request Message from:  Fax from Pharmacy on May 24, 2010 2:38 PM  Refills Requested: Medication #1:  VICODIN ES 7.5-750 MG  TABS 1 by mouth once daily as needed.   Dosage confirmed as above?Dosage Confirmed   Last Refilled: 11/07/2009   Notes: [facility] [place], Fax#[phone] Initial call taken by: Robin Ewing CMA (AAMA),  May 24, 2010 2:39 PM    New/Updated Medications: VICODIN ES 7.5-750 MG  TABS (HYDROCODONE-ACETAMINOPHEN) 1 by mouth once daily as needed Prescriptions: VICODIN ES 7.5-750 MG  TABS (HYDROCODONE-ACETAMINOPHEN) 1 by mouth once daily as needed  #30 x 5   Entered and Authorized by:   Corwin Levins MD   Signed by:   Corwin Levins MD on 05/24/2010   Method used:   Print then Give to Patient   RxID:   [id number]  done hardcopy to LIM side B - dahlia Corwin Levins MD  May 24, 2010 2:46 PM   Rs faxed to pharmacy Margaret Pyle, CMA  May 24, 2010 3:30 PM

## 2010-06-01 NOTE — Letter (Signed)
Summary: Generic Letter  [facility]  [address] [place], [place] [address]   Phone: [phone]  Fax: [phone]    05/11/2009  Barry Carter [address] [place], [place]  [address]  Dear Mr. Malia,   Recently our office has been unsuccessful in contacting you. Please give our office a call at [phone] to update your contact information and speak with an assistant. Thanks.        Sincerely,   [facility]  Appended Document: Generic Letter Phone number is [phone].

## 2010-06-01 NOTE — Progress Notes (Signed)
Summary: med refill  Phone Note Refill Request  on May 06, 2009 8:12 AM  Refills Requested: Medication #1:  VICODIN ES 7.5-750 MG  TABS 1 by mouth once daily as needed   Dosage confirmed as above?Dosage Confirmed   Last Refilled: 10/2008   Notes: [address] [place] fax#[phone] Initial call taken by: Scharlene Gloss,  May 06, 2009 8:12 AM  Follow-up for Phone Call        done hardcopy to LIM side B - dahlia  Follow-up by: Corwin Levins MD,  May 06, 2009 8:37 AM  Additional Follow-up for Phone Call Additional follow up Details #1::        [phone] is disconnected/not in service. Tried [phone] and left message on voicemail to call back to office. Additional Follow-up by: Lucious Groves,  May 06, 2009 9:09 AM    Additional Follow-up for Phone Call Additional follow up Details #2::    Patient 996# is still not working, left another message on the 704# to call back to office. Also requested that someone call the office if that is not the patients number. Follow-up by: Lucious Groves,  May 09, 2009 10:33 AM  Additional Follow-up for Phone Call Additional follow up Details #3:: Details for Additional Follow-up Action Taken: due to no response from patient sent generic letter asking patient to call the office. Additional Follow-up by: Lucious Groves,  May 11, 2009 10:07 AM  New/Updated Medications: VICODIN ES 7.5-750 MG  TABS (HYDROCODONE-ACETAMINOPHEN) 1 by mouth once daily as needed Prescriptions: VICODIN ES 7.5-750 MG  TABS (HYDROCODONE-ACETAMINOPHEN) 1 by mouth once daily as needed  #30 x 5   Entered and Authorized by:   Corwin Levins MD   Signed by:   Corwin Levins MD on 05/06/2009   Method used:   Print then Give to Patient   RxID:   [id number]  above noted Corwin Levins MD  May 11, 2009 10:10 AM  Appended Document: med refill Called to inform pt that his prescription for Vicodin is here at the office for pickup. Office has not had  correct phone number.  Appended Document: med refill phone number updated, rx faxed to pharmacy

## 2010-06-05 ENCOUNTER — Telehealth: Payer: Self-pay | Admitting: Internal Medicine

## 2010-06-15 NOTE — Progress Notes (Signed)
Summary: lipitor  Phone Note Refill Request Message from:  Fax from Pharmacy on June 05, 2010 9:18 AM  Refills Requested: Medication #1:  LIPITOR 80 MG  TABS 1/2 by mouth once daily   Dosage confirmed as above?Dosage Confirmed  Method Requested: Fax to [facility] Next Appointment Scheduled: none Initial call taken by: Brenton Grills CMA (AAMA),  June 05, 2010 9:18 AM    Prescriptions: LIPITOR 80 MG  TABS (ATORVASTATIN CALCIUM) 1/2 by mouth once daily  #45 x 1   Entered by:   Brenton Grills CMA (AAMA)   Authorized by:   Corwin Levins MD   Signed by:   Brenton Grills CMA (AAMA) on 06/05/2010   Method used:   Faxed to ...       [facility] (mail-order)             , [place]         Ph: [phone]       Fax: [phone]   RxID:   [id number]

## 2010-07-24 ENCOUNTER — Other Ambulatory Visit: Payer: Self-pay

## 2010-07-24 MED ORDER — ATORVASTATIN CALCIUM 80 MG PO TABS: 80.0000 mg | ORAL_TABLET | Freq: Every day | ORAL | Status: DC

## 2010-07-24 NOTE — Telephone Encounter (Signed)
Pharmacy requesting refill on Atorvastatin

## 2010-09-12 NOTE — Op Note (Signed)
NAME:  Barry Carter, Barry Carter NO.:  [id number]   MEDICAL RECORD NO.:  [id number]          PATIENT TYPE:  AMB   LOCATION:  [facility]                         FACILITY:  [facility]   PHYSICIAN:  Jene Every, M.D.    DATE OF BIRTH:  Aug 19, 1955   DATE OF PROCEDURE:  04/04/2007  DATE OF DISCHARGE:                               OPERATIVE REPORT   PREOPERATIVE DIAGNOSIS:  Medial meniscus tear, left knee.   POSTOPERATIVE DIAGNOSIS:  Grade 3 chondromalacia, medial femoral  condyle, small tear of the medial meniscus, anterior cruciate ligament  tear, partial.   PROCEDURE PERFORMED:  1. Exam under anesthesia.  2. Left knee arthroscopy, partial medial meniscectomy, chondroplasty      of medial femoral condyle.   ANESTHESIA:  General.   SURGEON:  Jene Every, M.D.   ASSISTANT:  No assistant.   BRIEF HISTORY AND INDICATIONS:  This is a 55 year old with knee pain  refractory to conserve treatment, MRI indicating meniscus tear.  Operative intervention is indicated for diagnosis and treatment.  Risks  and benefits were discussed including infection, damage to vascular  structures, no change in symptoms, worsening symptoms and need for  repeat debridement in the future.   TECHNIQUE:  The patient was placed in a supine position and after the  induction of adequate general and 2 g of Kefzol, the left lower  extremity was prepped and draped in the usual sterile fashion.  A  lateral parapatellar portal and superomedial parapatellar portal were  fashioned with a #11 blade.  Ingress cannula was atraumatically placed.  Irrigant was utilized to insufflate the joint.  Under direct  visualization, a medial parapatellar portal was fashioned with a #11  blade after localization with an 18-gauge needle, sparing the medial  meniscus.   Grade 3 changes of the medial femoral condyle were noted and  chondroplasty was performed; there were some in the sulcus and  chondroplasty was performed here.   Hemorrhagic tearing of the ACL was  noted.  There was no significant excursion with an anterior drawer.  The  femoral attachment remained intact.  It appeared to be in mid substance,  approximately 25% of the ACL.  There were some minor grade 3 changes of  lateral tibial plateau and a chondroplasty was performed here as well.  Degenerative tearing of the anterior horn of the medial meniscus was  noted and the shaver was introduced and utilized to shave it to a stable  base.  The remnant was stable to probe palpation.  I examined the top  and the bottom of the meniscus anteriorly and posteriorly and those  attachments remained intact.  The PCL was unremarkable.  Laterally, no  tear.  Anterior and lateral menisci were stable to probe palpation.  The  gutters were unremarkable with normal patellofemoral tracking and mild  degenerative changes of the patella.  This knee was copiously lavaged  and all instrumentation was removed.  Portals were closed for 4-0  nylon simple sutures and 0.25% Marcaine with epinephrine was infiltrated  in the joint.  The wound was dressed sterilely.  He was awoken without  difficulty and transported to the recovery room in satisfactory  condition.   The patient tolerated the procedure well with no complications.      Jene Every, M.D.  Electronically Signed     JB/MEDQ  D:  04/04/2007  T:  04/05/2007  Job:  [id number]

## 2010-09-15 NOTE — Op Note (Signed)
NAME:  Barry Carter, Barry Carter NO.:  [id number]   MEDICAL RECORD NO.:  [id number]         PATIENT TYPE:  HAMB   LOCATION:                               FACILITY:  [facility]   PHYSICIAN:  Jene Every, M.D.    DATE OF BIRTH:  [birth year]   DATE OF PROCEDURE:  07/12/2006  DATE OF DISCHARGE:                               OPERATIVE REPORT   PREOP:  Medial meniscus tear of the right knee, DJD of the left knee.   POSTOP:  Medial meniscus tear of the right knee, DJD of the left knee,  grade 3 chondromalacia of patella and medial femoral condyle right knee.   PROCEDURE:  Four-in-one right knee arthroscopy, partial medial  meniscectomy, chondroplasty patella and medial femoral condyle, left  knee intra-articular corticosteroid injection.   BRIEF HISTORY:  This is a 56 year old who fell onto his knees. MRI  indicating a meniscus tear on the right, locking and giving way is  indicative for partial medial meniscectomy.  He had aggravation of his  underlying osteoarthritis in the left knee and indicated a  corticosteroid injection there, arthroscopy of the right.  Risks and  benefits discussed included bleeding, infection, no change in symptoms,  worsening of symptoms and the need for repeat debridement, etc.   TECHNIQUE:  With the patient in supine position after induction of  adequate general anesthesia, 1 gram __________ the right lower extremity  was prepped and draped in the usual sterile fashion.  A lateral  parapatellar portal and superomedial parapatellar portal was fashioned  with a #11 blade.  The __________ cannula atraumatically placed and  irrigant was utilized to insufflate the joint.  Under direct  visualization medial parapatellar portal was fashioned with a #11 blade  after localization with an 18 gauge needle sparing the medial meniscus.  First noted was a posterior third medial meniscus tear, unstable. A  straight basket rongeur was introduced and utilized to  perform a partial  medial meniscectomy to a stable base, further contoured with the four-  tooth shaver. The remaining menisci was stable to probe palpation  without evidence of residual tear.  Grade 3 changes of the patella  medial femoral condyle and chondroplasties were performed here.  Normal  patellofemoral tracking.  The gutters were unremarkable.  Lateral  meniscus normal, femoral condyle, tibial plateau and lateral meniscus,  ACL and PCL were unremarkable.  The knee was copiously lavaged and all  compartments reexamined.  No residual pathology amendable to  arthroscopy.  Next, all instrumentation was removed. Portals were closed  with 4-0 nylon simple sutures and 0.25% Marcaine with epinephrine was  infiltrated into the joint.  The wound was dressed sterilely. He was  awoken without difficulty and transported to the recovery room in  satisfactory condition.   Patient tolerated the procedure well; no complications.      Jene Every, M.D.  Electronically Signed     JB/MEDQ  D:  07/12/2006  T:  07/13/2006  Job:  [id number]

## 2010-09-15 NOTE — Op Note (Signed)
NAME:  Barry Carter, Barry Carter NO.:  [id number]   MEDICAL RECORD NO.:  [id number]                   PATIENT TYPE:  AMB   LOCATION:  [facility]                                  FACILITY:  [facility]   PHYSICIAN:  Nadara Mustard, M.D.                DATE OF BIRTH:  1956/03/15   DATE OF PROCEDURE:  12/09/2003  DATE OF DISCHARGE:                                 OPERATIVE REPORT   PREOPERATIVE DIAGNOSIS:  Medial meniscal tear, left knee.   POSTOPERATIVE DIAGNOSES:  1. Medial meniscal tear, left knee.  2. Osteochondral defect, medial femoral condyle.   OPERATION PERFORMED:  1. Abrasion chondroplasty, medial femoral condyle.  2. Partial medial meniscectomy, left knee.   SURGEON:  Nadara Mustard, M.D.   ANESTHESIA:  Knee block.   ESTIMATED BLOOD LOSS:  Minimal.   ANTIBIOTICS:  None.   DRAINS:  None.   COMPLICATIONS:  None.   DISPOSITION:  To post anesthesia care unit in stable condition.   INDICATIONS FOR PROCEDURE:  The patient is a 55 year old gentleman with  mechanical symptoms of his left knee.  The patient has had mechanical  catching and locking and giving way, failure of conservative care.  MRI scan  confirmed a medial meniscal tear of the left knee and he presents at this  time for arthroscopic intervention.  The risks and benefits were discussed  including infection, neurovascular injury, persistent pain, need for  additional surgery.  The patient states he understands and wishes to proceed  at this time.   DESCRIPTION OF PROCEDURE:  The patient underwent a knee block in the holding  area.  After adequate level of anesthesia obtained, the patient was brought  to operating room 5 and underwent a MAC anesthetic.  After an adequate level  of anesthesia was obtained, the patient's left lower extremity was prepped  using DuraPrep and draped into a sterile field.  The scope was inserted from  the inferolateral portal and a working portal was established  inferomedially.  The patient did have a significant amount of synovitis and  this was debrided.  Examination of the medial femoral condyle showed a large  osteochondral defect and this was debrided back to bleeding, viable  subchondral bone.  There were no loose cartilage edges.  The patient also  had a degenerative tear of the posterior horn of the medial meniscus and  this was also debrided with a shaver.  Attention was then focused in the  notch and this showed a stable, intact ACL.  Visualization of the lateral  joint line in the figure 4 position showed good intact stable cartilage of  both the lateral plateau and lateral femoral condyle.  Attention was then  focused in the patellofemoral joint.  Synovitis was debrided and there was  no articular cartilage defects in the patellofemoral joint.  A survey was  again performed through all three compartments.  There  were no loose bodies,  no loose bodies in the gutter.  The instruments were removed.  The portals  were closed using 4-0 nylon.  The wounds were covered with Adaptic  orthopedic sponges, sterile Webril and a Coban dressing.  The patient was  then taken to PACU in stable condition.                                               Nadara Mustard, M.D.    MVD/MEDQ  D:  12/09/2003  T:  12/10/2003  Job:  [id number]

## 2010-11-09 ENCOUNTER — Other Ambulatory Visit: Payer: Self-pay | Admitting: *Deleted

## 2010-11-09 MED ORDER — HYDROCODONE-ACETAMINOPHEN 7.5-750 MG PO TABS: 1.0000 | ORAL_TABLET | Freq: Every day | ORAL | Status: AC | PRN

## 2010-11-09 NOTE — Telephone Encounter (Signed)
Refill for Hydrocodone/APAP-last filled 10/10/2010-please advise

## 2010-11-10 NOTE — Telephone Encounter (Signed)
Rx faxed to pharmacy  

## 2010-12-02 ENCOUNTER — Other Ambulatory Visit: Payer: Self-pay | Admitting: Internal Medicine

## 2010-12-09 ENCOUNTER — Encounter: Payer: Self-pay | Admitting: Internal Medicine

## 2010-12-09 DIAGNOSIS — Z0000 Encounter for general adult medical examination without abnormal findings: Secondary | ICD-10-CM | POA: Insufficient documentation

## 2010-12-12 ENCOUNTER — Ambulatory Visit: Payer: Self-pay

## 2010-12-12 ENCOUNTER — Other Ambulatory Visit: Payer: Self-pay

## 2010-12-12 DIAGNOSIS — Z1289 Encounter for screening for malignant neoplasm of other sites: Secondary | ICD-10-CM

## 2010-12-12 DIAGNOSIS — Z0000 Encounter for general adult medical examination without abnormal findings: Secondary | ICD-10-CM

## 2010-12-12 LAB — BASIC METABOLIC PANEL
BUN: 17 mg/dL (ref 6–23)
CO2: 28 mEq/L (ref 19–32)
Chloride: 105 mEq/L (ref 96–112)
Creatinine, Ser: 0.8 mg/dL (ref 0.4–1.5)
Glucose, Bld: 90 mg/dL (ref 70–99)

## 2010-12-12 LAB — HEPATIC FUNCTION PANEL
ALT: 32 U/L (ref 0–53)
Albumin: 4.3 g/dL (ref 3.5–5.2)
Bilirubin, Direct: 0.2 mg/dL (ref 0.0–0.3)
Total Protein: 6.7 g/dL (ref 6.0–8.3)

## 2010-12-12 LAB — URINALYSIS
Urine Glucose: NEGATIVE
Urobilinogen, UA: 0.2 (ref 0.0–1.0)

## 2010-12-12 LAB — LIPID PANEL
HDL: 52.8 mg/dL (ref >39.00)
Total CHOL/HDL Ratio: 3

## 2010-12-12 LAB — CBC WITH DIFFERENTIAL/PLATELET
Basophils Relative: 0.4 % (ref 0.0–3.0)
HCT: 41 % (ref 39.0–52.0)
Hemoglobin: 13.7 g/dL (ref 13.0–17.0)
Lymphocytes Relative: 36.9 % (ref 12.0–46.0)
Monocytes Relative: 9 % (ref 3.0–12.0)
Neutro Abs: 4.1 10*3/uL (ref 1.4–7.7)
RBC: 4.28 Mil/uL (ref 4.22–5.81)

## 2010-12-12 LAB — TSH: TSH: 1.36 u[IU]/mL (ref 0.35–5.50)

## 2010-12-15 ENCOUNTER — Encounter: Payer: Self-pay | Admitting: Internal Medicine

## 2010-12-15 ENCOUNTER — Ambulatory Visit (INDEPENDENT_AMBULATORY_CARE_PROVIDER_SITE_OTHER): Payer: PRIVATE HEALTH INSURANCE | Admitting: Internal Medicine

## 2010-12-15 VITALS — BP 122/78 | HR 65 | Temp 98.0°F | Ht 78.0 in | Wt 256.6 lb

## 2010-12-15 DIAGNOSIS — R079 Chest pain, unspecified: Secondary | ICD-10-CM | POA: Insufficient documentation

## 2010-12-15 DIAGNOSIS — R609 Edema, unspecified: Secondary | ICD-10-CM

## 2010-12-15 DIAGNOSIS — E785 Hyperlipidemia, unspecified: Secondary | ICD-10-CM

## 2010-12-15 DIAGNOSIS — M722 Plantar fascial fibromatosis: Secondary | ICD-10-CM

## 2010-12-15 DIAGNOSIS — M549 Dorsalgia, unspecified: Secondary | ICD-10-CM | POA: Insufficient documentation

## 2010-12-15 DIAGNOSIS — Z0000 Encounter for general adult medical examination without abnormal findings: Secondary | ICD-10-CM

## 2010-12-15 MED ORDER — HYDROCHLOROTHIAZIDE 12.5 MG PO CAPS: 12.5000 mg | ORAL_CAPSULE | Freq: Every day | ORAL | Status: DC | PRN

## 2010-12-15 MED ORDER — HYDROCODONE-ACETAMINOPHEN 7.5-750 MG PO TABS: 1.0000 | ORAL_TABLET | Freq: Four times a day (QID) | ORAL | Status: DC | PRN

## 2010-12-15 MED ORDER — ATORVASTATIN CALCIUM 80 MG PO TABS: 80.0000 mg | ORAL_TABLET | Freq: Every day | ORAL | Status: DC

## 2010-12-15 MED ORDER — OMEPRAZOLE 20 MG PO CPDR: 20.0000 mg | DELAYED_RELEASE_CAPSULE | Freq: Two times a day (BID) | ORAL | Status: DC

## 2010-12-15 NOTE — Assessment & Plan Note (Signed)
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

## 2010-12-15 NOTE — Patient Instructions (Addendum)
Take all new medications as prescribed - the naproxen anti-inflammatory Continue all other medications as before (all sent to [facility] except for the hydrocodone) You will be contacted regarding the referral for: stress test You will be contacted regarding the referral for: orthopedic and podiatry in [place], [place] Please return in 1 year for your yearly visit, or sooner if needed, with Lab testing done 3-5 days before

## 2010-12-15 NOTE — Assessment & Plan Note (Signed)
Mild LLE, likely venous insuff - for low dose hctz 12.5 prn

## 2010-12-17 ENCOUNTER — Encounter: Payer: Self-pay | Admitting: Internal Medicine

## 2010-12-17 NOTE — Progress Notes (Signed)
Subjective:    Patient ID: Barry Carter, male    DOB: 24-Feb-1956, 55 y.o.   MRN: [id number]  HPI  Here for wellness and f/u;  Overall doing ok;  Pt denies worsening SOB, DOE, wheezing, orthopnea, PND, worsening LE edema, palpitations, dizziness or syncope, except has had somewhat vague chest pressure to the anterior upper chest without assoc symptoms off and on for several weeks to months.  Pt denies neurological change such as new Headache, facial or extremity weakness.  Pt denies polydipsia, polyuria, or low sugar symptoms. Pt states overall good compliance with treatment and medications, good tolerability, and trying to follow lower cholesterol diet.  Pt denies worsening depressive symptoms, suicidal ideation or panic. No fever, wt loss, night sweats, loss of appetite, or other constitutional symptoms.  Pt states good ability with ADL's, low fall risk, home safety reviewed and adequate, no significant changes in hearing or vision, and occasionally active with exercise.  Has had a few lbs wt gain, c/o left mod to severe left heel/plantar fasciititis type pain, as well a puffiness/swelling of the left leg only below the knee, as well as recent mild worsening recurring left LBP without bowel or bladder change, fever, wt loss,  worsening LE numbness/weakness, gait change or falls.  Also has recurring several months recurring pain to the bilat post neck and upper back and shoulder pain without other UE pain/weakness/numbness. Past Medical History  Diagnosis Date  . ALLERGIC RHINITIS 12/30/2006  . CHEST PAIN 09/11/2007  . GERD 12/30/2006  . HEMORRHOIDS 12/28/2006  . HYPERLIPIDEMIA 12/30/2006  . LOW BACK PAIN 12/30/2006  . OBESITY 12/28/2006  . OSTEOARTHRITIS 12/30/2006   Past Surgical History  Procedure Date  . Vasectomy   . S/p right knee surgery     reports that he has never smoked. He does not have any smokeless tobacco history on file. He reports that he does not drink alcohol or use illicit  drugs. family history includes Diabetes in his other; Heart disease in his other; and Hypertension in his other. No Known Allergies Current Outpatient Prescriptions on File Prior to Visit  Medication Sig Dispense Refill  . aspirin 81 MG tablet Take 81 mg by mouth daily.         Review of Systems Review of Systems  Constitutional: Negative for diaphoresis, activity change, appetite change and unexpected weight change.  HENT: Negative for hearing loss, ear pain, facial swelling, mouth sores and neck stiffness.   Eyes: Negative for pain, redness and visual disturbance.  Respiratory: Negative for shortness of breath and wheezing.   Cardiovascular: Negative for chest pain and palpitations.  Gastrointestinal: Negative for diarrhea, blood in stool, abdominal distention and rectal pain.  Genitourinary: Negative for hematuria, flank pain and decreased urine volume.  Musculoskeletal: Negative for myalgias and joint swelling.  Skin: Negative for color change and wound.  Neurological: Negative for syncope and numbness.  Hematological: Negative for adenopathy.  Psychiatric/Behavioral: Negative for hallucinations, self-injury, decreased concentration and agitation.      Objective:   Physical Exam BP 122/78  Pulse 65  Temp(Src) 98 F (36.7 C) (Oral)  Ht 6\' 6"  (1.981 m)  Wt 256 lb 9.6 oz (116.393 kg)  BMI 29.65 kg/m2  SpO2 99% Physical Exam  VS noted Constitutional: Pt is oriented to person, place, and time. Appears well-developed and well-nourished.  HENT:  Head: Normocephalic and atraumatic.  Right Ear: External ear normal.  Left Ear: External ear normal.  Nose: Nose normal.  Mouth/Throat: Oropharynx is clear and  moist.  Eyes: Conjunctivae and EOM are normal. Pupils are equal, round, and reactive to light.  Neck: Normal range of motion. Neck supple. No JVD present. No tracheal deviation present.  Cardiovascular: Normal rate, regular rhythm, normal heart sounds and intact distal pulses.    Pulmonary/Chest: Effort normal and breath sounds normal.  Abdominal: Soft. Bowel sounds are normal. There is no tenderness.  Musculoskeletal: Normal range of motion. Exhibits no trace edema LLE below knee,  Tender left plantar heel o/w leg neurovasc intact  Lymphadenopathy:  Has no cervical adenopathy.  Neurological: Pt is alert and oriented to person, place, and time. Pt has normal reflexes. No cranial nerve deficit. Motor/sens/dtr intact Skin: Skin is warm and dry. No rash noted.  Psychiatric:  Has  normal mood and affect. Behavior is normal.        Assessment & Plan:

## 2010-12-21 ENCOUNTER — Ambulatory Visit (HOSPITAL_COMMUNITY): Payer: PRIVATE HEALTH INSURANCE | Attending: Internal Medicine | Admitting: Radiology

## 2010-12-21 DIAGNOSIS — R0989 Other specified symptoms and signs involving the circulatory and respiratory systems: Secondary | ICD-10-CM

## 2010-12-21 DIAGNOSIS — R0602 Shortness of breath: Secondary | ICD-10-CM

## 2010-12-21 DIAGNOSIS — R079 Chest pain, unspecified: Secondary | ICD-10-CM

## 2010-12-21 DIAGNOSIS — I4949 Other premature depolarization: Secondary | ICD-10-CM

## 2010-12-21 DIAGNOSIS — R0789 Other chest pain: Secondary | ICD-10-CM

## 2010-12-21 MED ORDER — TECHNETIUM TC 99M TETROFOSMIN IV KIT
33.0000 | PACK | Freq: Once | INTRAVENOUS | Status: AC | PRN
  Administered 2010-12-21: 33 via INTRAVENOUS

## 2010-12-21 MED ORDER — TECHNETIUM TC 99M TETROFOSMIN IV KIT
11.0000 | PACK | Freq: Once | INTRAVENOUS | Status: AC | PRN
  Administered 2010-12-21: 11 via INTRAVENOUS

## 2010-12-21 NOTE — Progress Notes (Signed)
[facility] SITE 3 NUCLEAR MED [address] [place] [address] [phone]  Cardiology Nuclear Med Study  Barry Carter is a 55 y.o. male [id number] Sep 11, 1955   Nuclear Med Background Indication for Stress Test:  Evaluation for Ischemia History: 05/09 Myocardial Perfusion Study: NL EF 72% Cardiac Risk Factors: Family History - CAD, Lipids and Obesity  Symptoms:  Chest Pain, Chest Pressure, DOE, Palpitations and SOB   Nuclear Pre-Procedure Caffeine/Decaff Intake:  None NPO After: 9:00pm   Lungs:  clear IV 0.9% NS with Angio Cath:  20g  IV Site: R Antecubital  IV Started by:  Bonnita Levan, RN  Chest Size (in):  46 Cup Size: n/a  Height: 6\' 6"  (1.981 m)  Weight:  256 lb (116.121 kg)  BMI:  Body mass index is 29.58 kg/(m^2). Tech Comments:  N/A    Nuclear Med Study 1 or 2 day study: 1 day  Stress Test Type:  Stress  Reading MD: Cassell Clement, MD  Order Authorizing Provider:  J.John  Resting Radionuclide: Technetium 13m Tetrofosmin  Resting Radionuclide Dose: 11.0 mCi   Stress Radionuclide:  Technetium 37m Tetrofosmin  Stress Radionuclide Dose: 33.0 mCi           Stress Protocol Rest HR: 61 Stress HR: 150  Rest BP: 128/82 Stress BP: 174/71  Exercise Time (min): 9:00 METS: 10.10   Predicted Max HR: 167 bpm % Max HR: 89.82 bpm Rate Pressure Product: 27253   Dose of Adenosine (mg):  n/a Dose of Lexiscan: n/a mg  Dose of Atropine (mg): n/a Dose of Dobutamine: n/a mcg/kg/min (at max HR)  Stress Test Technologist: Milana Na, EMT-P  Nuclear Technologist:  Doyne Keel, CNMT     Rest Procedure:  Myocardial perfusion imaging was performed at rest 45 minutes following the intravenous administration of Technetium 31m Tetrofosmin. Rest ECG: NSR  Stress Procedure:  The patient exercised for 9:00.  The patient stopped due to fatigue and denied any chest pain.  There were no significant ST-T wave changes and rare pvcs. The patient also stated that  he felt very weak and run down after his test. Technetium 70m Tetrofosmin was injected at peak exercise and myocardial perfusion imaging was performed after a brief delay. Stress ECG: No significant change from baseline ECG  QPS Raw Data Images:  Normal; no motion artifact; normal heart/lung ratio. Stress Images:  Normal homogeneous uptake in all areas of the myocardium. Rest Images:  Normal homogeneous uptake in all areas of the myocardium. Subtraction (SDS):  No evidence of ischemia. Transient Ischemic Dilatation (Normal <1.22):  0.93 Lung/Heart Ratio (Normal <0.45):  0.43  Quantitative Gated Spect Images QGS EDV:  104 ml QGS ESV:  32 ml QGS cine images:  NL LV Function; NL Wall Motion QGS EF: 70%  Impression Exercise Capacity:  Good exercise capacity. BP Response:  Normal blood pressure response. Clinical Symptoms:  No chest pain. ECG Impression:  No significant ST segment change suggestive of ischemia. Comparison with Prior Nuclear Study: No significant change from previous study  Overall Impression:  Normal stress nuclear study.    Cassell Clement

## 2010-12-28 ENCOUNTER — Encounter (HOSPITAL_COMMUNITY): Payer: PRIVATE HEALTH INSURANCE | Admitting: Radiology

## 2011-02-05 ENCOUNTER — Other Ambulatory Visit: Payer: Self-pay | Admitting: Internal Medicine

## 2011-02-05 LAB — POCT HEMOGLOBIN-HEMACUE
Hemoglobin: 14.6
Operator id: 20873

## 2011-05-14 ENCOUNTER — Other Ambulatory Visit: Payer: Self-pay

## 2011-05-14 MED ORDER — HYDROCODONE-ACETAMINOPHEN 7.5-750 MG PO TABS: 1.0000 | ORAL_TABLET | Freq: Four times a day (QID) | ORAL | Status: DC | PRN

## 2011-05-14 NOTE — Telephone Encounter (Signed)
Faxed hardcopy to pharmacy. 

## 2011-05-14 NOTE — Telephone Encounter (Signed)
Done hardcopy to robin  

## 2011-06-28 ENCOUNTER — Other Ambulatory Visit: Payer: Self-pay | Admitting: Internal Medicine

## 2011-09-11 ENCOUNTER — Other Ambulatory Visit: Payer: Self-pay

## 2011-09-11 MED ORDER — HYDROCODONE-ACETAMINOPHEN 7.5-750 MG PO TABS: 1.0000 | ORAL_TABLET | Freq: Four times a day (QID) | ORAL | Status: DC | PRN

## 2011-09-11 NOTE — Telephone Encounter (Signed)
Done hardcopy to robin  

## 2011-09-11 NOTE — Telephone Encounter (Signed)
Called the patient he thought June was when refill was due, please advise

## 2011-09-11 NOTE — Telephone Encounter (Signed)
Informed the pharmacy MD's response on refill.

## 2011-09-11 NOTE — Telephone Encounter (Signed)
Faxed to hardcopy to pharmacy and called the patient to inform as well.

## 2011-09-11 NOTE — Telephone Encounter (Signed)
At one per day, pt should have refill until at least early July  -   Too soon for vicodin refill  Please inform pt

## 2011-12-21 ENCOUNTER — Encounter: Payer: PRIVATE HEALTH INSURANCE | Admitting: Internal Medicine

## 2012-01-07 ENCOUNTER — Other Ambulatory Visit: Payer: Self-pay | Admitting: Internal Medicine

## 2012-01-31 ENCOUNTER — Other Ambulatory Visit: Payer: Self-pay

## 2012-01-31 MED ORDER — HYDROCODONE-ACETAMINOPHEN 7.5-750 MG PO TABS: 1.0000 | ORAL_TABLET | Freq: Four times a day (QID) | ORAL | Status: DC | PRN

## 2012-01-31 NOTE — Telephone Encounter (Signed)
Ok or pain med to last to next visit - Done hardcopy to [employer]

## 2012-01-31 NOTE — Telephone Encounter (Signed)
Faxed to pharmacy

## 2012-01-31 NOTE — Telephone Encounter (Signed)
Patient requesting refill on hydrocodone 7.5/750.  I called the patient to remind to schedule appt. As is overdue (last OV 8/12).  The patient informed he  has appointment on 04/17/12, it was the earliest they had.  Please advise on refill

## 2012-02-25 ENCOUNTER — Other Ambulatory Visit: Payer: Self-pay | Admitting: Internal Medicine

## 2012-04-16 ENCOUNTER — Other Ambulatory Visit (INDEPENDENT_AMBULATORY_CARE_PROVIDER_SITE_OTHER): Payer: PRIVATE HEALTH INSURANCE

## 2012-04-16 ENCOUNTER — Telehealth: Payer: Self-pay

## 2012-04-16 DIAGNOSIS — Z0000 Encounter for general adult medical examination without abnormal findings: Secondary | ICD-10-CM

## 2012-04-16 LAB — PSA: PSA: 1.26 ng/mL (ref 0.10–4.00)

## 2012-04-16 LAB — HEPATIC FUNCTION PANEL
ALT: 41 U/L (ref 0–53)
AST: 25 U/L (ref 0–37)
Bilirubin, Direct: 0.1 mg/dL (ref 0.0–0.3)
Total Protein: 7 g/dL (ref 6.0–8.3)

## 2012-04-16 LAB — CBC WITH DIFFERENTIAL/PLATELET
Basophils Absolute: 0 10*3/uL (ref 0.0–0.1)
Basophils Relative: 0.3 % (ref 0.0–3.0)
Hemoglobin: 14.1 g/dL (ref 13.0–17.0)
Lymphocytes Relative: 37.4 % (ref 12.0–46.0)
Monocytes Relative: 8.3 % (ref 3.0–12.0)
Neutro Abs: 4.8 10*3/uL (ref 1.4–7.7)
RBC: 4.53 Mil/uL (ref 4.22–5.81)

## 2012-04-16 LAB — LIPID PANEL
Cholesterol: 157 mg/dL (ref 0–200)
LDL Cholesterol: 87 mg/dL (ref 0–99)
Total CHOL/HDL Ratio: 3

## 2012-04-16 LAB — BASIC METABOLIC PANEL
CO2: 29 mEq/L (ref 19–32)
Chloride: 103 mEq/L (ref 96–112)
Creatinine, Ser: 0.9 mg/dL (ref 0.4–1.5)
Potassium: 4.1 mEq/L (ref 3.5–5.1)

## 2012-04-16 NOTE — Telephone Encounter (Signed)
Put lab order in. 

## 2012-04-17 ENCOUNTER — Encounter: Payer: Self-pay | Admitting: Internal Medicine

## 2012-04-17 ENCOUNTER — Ambulatory Visit (INDEPENDENT_AMBULATORY_CARE_PROVIDER_SITE_OTHER): Payer: PRIVATE HEALTH INSURANCE | Admitting: Internal Medicine

## 2012-04-17 VITALS — BP 134/80 | HR 82 | Temp 98.4°F | Ht 78.0 in | Wt 265.4 lb

## 2012-04-17 DIAGNOSIS — Z0000 Encounter for general adult medical examination without abnormal findings: Secondary | ICD-10-CM

## 2012-04-17 DIAGNOSIS — R972 Elevated prostate specific antigen [PSA]: Secondary | ICD-10-CM

## 2012-04-17 MED ORDER — ATORVASTATIN CALCIUM 80 MG PO TABS: 80.0000 mg | ORAL_TABLET | Freq: Every day | ORAL | Status: DC

## 2012-04-17 MED ORDER — HYDROCODONE-ACETAMINOPHEN 10-325 MG PO TABS: 1.0000 | ORAL_TABLET | Freq: Four times a day (QID) | ORAL | Status: DC | PRN

## 2012-04-17 MED ORDER — HYDROCHLOROTHIAZIDE 12.5 MG PO CAPS: 12.5000 mg | ORAL_CAPSULE | Freq: Every day | ORAL | Status: DC

## 2012-04-17 MED ORDER — OMEPRAZOLE 20 MG PO CPDR: 20.0000 mg | DELAYED_RELEASE_CAPSULE | Freq: Every day | ORAL | Status: DC

## 2012-04-17 NOTE — Assessment & Plan Note (Signed)

## 2012-04-17 NOTE — Progress Notes (Signed)
Subjective:    Patient ID: Barry Carter, male    DOB: [birth year], 56 y.o.   MRN: [id number]  HPI  Here for wellness and f/u;  Overall doing ok;  Pt denies CP, worsening SOB, DOE, wheezing, orthopnea, PND, worsening LE edema, palpitations, dizziness or syncope.  Pt denies neurological change such as new Headache, facial or extremity weakness.  Pt denies polydipsia, polyuria, or low sugar symptoms. Pt states overall good compliance with treatment and medications, good tolerability, and trying to follow lower cholesterol diet.  Pt denies worsening depressive symptoms, suicidal ideation or panic. No fever, wt loss, night sweats, loss of appetite, or other constitutional symptoms.  Pt states good ability with ADL's, low fall risk, home safety reviewed and adequate, no significant changes in hearing or vision, and occasionally active with exercise.  Pt continues to have recurring left LBP without change in severity, bowel or bladder change, fever, wt loss,  worsening LE pain/numbness/weakness, gait change or falls, except with occasional numb to left foot, occas right too.  Will eventually need lumbar fusion, needs better pain control. Has ongoing bilat knee pain, but not needing TKR anytime soon. Currently lives in [place], needs pain med refills. Past Medical History  Diagnosis Date  . ALLERGIC RHINITIS 12/30/2006  . CHEST PAIN 09/11/2007  . GERD 12/30/2006  . HEMORRHOIDS 12/28/2006  . HYPERLIPIDEMIA 12/30/2006  . LOW BACK PAIN 12/30/2006  . OBESITY 12/28/2006  . OSTEOARTHRITIS 12/30/2006   Past Surgical History  Procedure Date  . Vasectomy   . S/p right knee surgery     reports that he has never smoked. He does not have any smokeless tobacco history on file. He reports that he does not drink alcohol or use illicit drugs. family history includes Diabetes in his other; Heart disease in his other; and Hypertension in his other. No Known Allergies Current Outpatient Prescriptions on File Prior to Visit   Medication Sig Dispense Refill  . aspirin 81 MG tablet Take 81 mg by mouth daily.        Marland Kitchen atorvastatin (LIPITOR) 80 MG tablet Take 1 tablet (80 mg total) by mouth daily.  90 tablet  3  . hydrochlorothiazide (MICROZIDE) 12.5 MG capsule Take 1 capsule (12.5 mg total) by mouth daily.  90 capsule  3  . omeprazole (PRILOSEC) 20 MG capsule Take 1 capsule (20 mg total) by mouth daily.  90 capsule  3   Review of Systems Review of Systems  Constitutional: Negative for diaphoresis, activity change, appetite change and unexpected weight change.  HENT: Negative for hearing loss, ear pain, facial swelling, mouth sores and neck stiffness.   Eyes: Negative for pain, redness and visual disturbance.  Respiratory: Negative for shortness of breath and wheezing.   Cardiovascular: Negative for chest pain and palpitations.  Gastrointestinal: Negative for diarrhea, blood in stool, abdominal distention and rectal pain.  Genitourinary: Negative for hematuria, flank pain and decreased urine volume.  Musculoskeletal: Negative for myalgias and joint swelling.  Skin: Negative for color change and wound.  Neurological: Negative for syncope and numbness.  Hematological: Negative for adenopathy.  Psychiatric/Behavioral: Negative for hallucinations, self-injury, decreased concentration and agitation.      Objective:   Physical Exam BP 134/80  Pulse 82  Temp 98.4 F (36.9 C) (Oral)  Ht 6\' 6"  (1.981 m)  Wt 265 lb 6 oz (120.373 kg)  BMI 30.67 kg/m2  SpO2 96% Physical Exam  VS noted Constitutional: Pt is oriented to person, place, and time. Appears well-developed and well-nourished.  HENT:  Head: Normocephalic and atraumatic.  Right Ear: External ear normal.  Left Ear: External ear normal.  Nose: Nose normal.  Mouth/Throat: Oropharynx is clear and moist.  Eyes: Conjunctivae and EOM are normal. Pupils are equal, round, and reactive to light.  Neck: Normal range of motion. Neck supple. No JVD present. No  tracheal deviation present.  Cardiovascular: Normal rate, regular rhythm, normal heart sounds and intact distal pulses.   Pulmonary/Chest: Effort normal and breath sounds normal.  Abdominal: Soft. Bowel sounds are normal. There is no tenderness.  Musculoskeletal: Normal range of motion. Exhibits no edema.  Lymphadenopathy:  Has no cervical adenopathy.  Neurological: Pt is alert and oriented to person, place, and time. Pt has normal reflexes. No cranial nerve deficit.  Skin: Skin is warm and dry. No rash noted.  Psychiatric:  Has  normal mood and affect. Behavior is normal.  Left knee warm, mild effusion, large crepitus    Assessment & Plan:

## 2012-04-17 NOTE — Patient Instructions (Addendum)
Continue all other medications as before Your refills were done today as requested Please have the pharmacy call with any other refills you may need. Please continue your efforts at being more active, low cholesterol diet, and weight control. You are otherwise up to date with prevention Thank you for enrolling in MyChart. Please follow the instructions below to securely access your online medical record. MyChart allows you to send messages to your doctor, view your test results, renew your prescriptions, schedule appointments, and more. To Log into MyChart, please go to [email or web address], and your Username is: [email or web address] Please keep your appointments with your specialists as you do Please return for LAB only in 6 months (the follow up PSA), or use the prescription provided to have done locally Please return in 1 year for your yearly visit, or sooner if needed, with Lab testing done 3-5 days before

## 2012-04-17 NOTE — Assessment & Plan Note (Signed)
Somewhat concerning increased PSA over usual level, for f/u at 6 mo

## 2012-04-20 ENCOUNTER — Encounter: Payer: Self-pay | Admitting: Internal Medicine

## 2012-04-21 ENCOUNTER — Telehealth: Payer: Self-pay | Admitting: Internal Medicine

## 2012-04-21 MED ORDER — HYDROCODONE-ACETAMINOPHEN 10-325 MG PO TABS: 1.0000 | ORAL_TABLET | Freq: Four times a day (QID) | ORAL | Status: DC | PRN

## 2012-04-21 NOTE — Telephone Encounter (Signed)
Faxed hardcopy to pharmcacy

## 2012-04-21 NOTE — Telephone Encounter (Signed)
Per mychart request, for norco to [facility] [place]

## 2012-06-14 ENCOUNTER — Other Ambulatory Visit: Payer: Self-pay

## 2012-08-20 ENCOUNTER — Other Ambulatory Visit: Payer: Self-pay | Admitting: Internal Medicine

## 2012-11-14 ENCOUNTER — Other Ambulatory Visit: Payer: Self-pay

## 2012-11-14 MED ORDER — HYDROCODONE-ACETAMINOPHEN 10-325 MG PO TABS: 1.0000 | ORAL_TABLET | Freq: Four times a day (QID) | ORAL | Status: DC | PRN

## 2012-11-14 NOTE — Telephone Encounter (Signed)
Done hardcopy to robin  

## 2012-11-14 NOTE — Telephone Encounter (Signed)
Faxed hardcopy to [employer] [facility]

## 2013-01-19 ENCOUNTER — Other Ambulatory Visit: Payer: Self-pay | Admitting: Internal Medicine

## 2013-03-02 ENCOUNTER — Telehealth: Payer: Self-pay | Admitting: *Deleted

## 2013-03-02 MED ORDER — HYDROCODONE-ACETAMINOPHEN 10-325 MG PO TABS: 1.0000 | ORAL_TABLET | Freq: Four times a day (QID) | ORAL | Status: DC | PRN

## 2013-03-02 NOTE — Telephone Encounter (Signed)
Done hardcopy to robin; to also let pt know:  You are given the letter today explaining the transitional pain medication refill policy due to recent change [place] law;   Please be aware that I will no longer be able to offer monthly refills of any Schedule II or higher medication starting May 31, 2013

## 2013-03-02 NOTE — Telephone Encounter (Signed)
Pt called requesting Hydrocodone refill.  Please advise 

## 2013-03-03 NOTE — Telephone Encounter (Signed)
Called the patient informed refills are ready for pickup at the front desk.  Also informed of letter attached to refills regarding MD policy change.

## 2013-03-05 ENCOUNTER — Other Ambulatory Visit: Payer: Self-pay

## 2013-04-10 ENCOUNTER — Other Ambulatory Visit: Payer: Self-pay | Admitting: Internal Medicine

## 2013-05-08 ENCOUNTER — Ambulatory Visit (INDEPENDENT_AMBULATORY_CARE_PROVIDER_SITE_OTHER): Payer: BC Managed Care – PPO | Admitting: Internal Medicine

## 2013-05-08 ENCOUNTER — Encounter: Payer: Self-pay | Admitting: Internal Medicine

## 2013-05-08 ENCOUNTER — Other Ambulatory Visit (INDEPENDENT_AMBULATORY_CARE_PROVIDER_SITE_OTHER): Payer: BC Managed Care – PPO

## 2013-05-08 VITALS — BP 148/88 | HR 78 | Temp 98.2°F | Ht 78.0 in | Wt 269.0 lb

## 2013-05-08 DIAGNOSIS — M25562 Pain in left knee: Secondary | ICD-10-CM

## 2013-05-08 DIAGNOSIS — M25569 Pain in unspecified knee: Secondary | ICD-10-CM

## 2013-05-08 DIAGNOSIS — E785 Hyperlipidemia, unspecified: Secondary | ICD-10-CM

## 2013-05-08 DIAGNOSIS — M25561 Pain in right knee: Secondary | ICD-10-CM

## 2013-05-08 DIAGNOSIS — Z23 Encounter for immunization: Secondary | ICD-10-CM

## 2013-05-08 DIAGNOSIS — Z0000 Encounter for general adult medical examination without abnormal findings: Secondary | ICD-10-CM

## 2013-05-08 DIAGNOSIS — G8929 Other chronic pain: Secondary | ICD-10-CM | POA: Insufficient documentation

## 2013-05-08 LAB — CBC WITH DIFFERENTIAL/PLATELET
BASOS ABS: 0.1 10*3/uL (ref 0.0–0.1)
BASOS PCT: 0.5 % (ref 0.0–3.0)
Eosinophils Absolute: 0.1 10*3/uL (ref 0.0–0.7)
Eosinophils Relative: 1.5 % (ref 0.0–5.0)
HEMATOCRIT: 41.1 % (ref 39.0–52.0)
Hemoglobin: 13.8 g/dL (ref 13.0–17.0)
LYMPHS PCT: 40.4 % (ref 12.0–46.0)
Lymphs Abs: 3.9 10*3/uL (ref 0.7–4.0)
MCHC: 33.5 g/dL (ref 30.0–36.0)
MCV: 93.2 fl (ref 78.0–100.0)
MONOS PCT: 7.7 % (ref 3.0–12.0)
Monocytes Absolute: 0.7 10*3/uL (ref 0.1–1.0)
Neutro Abs: 4.8 10*3/uL (ref 1.4–7.7)
Neutrophils Relative %: 49.9 % (ref 43.0–77.0)
Platelets: 286 10*3/uL (ref 150.0–400.0)
RBC: 4.41 Mil/uL (ref 4.22–5.81)
RDW: 13.5 % (ref 11.5–14.6)
WBC: 9.6 10*3/uL (ref 4.5–10.5)

## 2013-05-08 LAB — HEPATIC FUNCTION PANEL
ALBUMIN: 4.1 g/dL (ref 3.5–5.2)
ALK PHOS: 76 U/L (ref 39–117)
ALT: 38 U/L (ref 0–53)
AST: 24 U/L (ref 0–37)
Bilirubin, Direct: 0.2 mg/dL (ref 0.0–0.3)
TOTAL PROTEIN: 6.8 g/dL (ref 6.0–8.3)
Total Bilirubin: 0.7 mg/dL (ref 0.3–1.2)

## 2013-05-08 LAB — LIPID PANEL
CHOLESTEROL: 174 mg/dL (ref 0–200)
HDL: 51.9 mg/dL (ref >39.00)
LDL Cholesterol: 106 mg/dL — ABNORMAL HIGH (ref 0–99)
TRIGLYCERIDES: 83 mg/dL (ref 0.0–149.0)
Total CHOL/HDL Ratio: 3
VLDL: 16.6 mg/dL (ref 0.0–40.0)

## 2013-05-08 LAB — URINALYSIS, ROUTINE W REFLEX MICROSCOPIC
Bilirubin Urine: NEGATIVE
HGB URINE DIPSTICK: NEGATIVE
KETONES UR: NEGATIVE
Leukocytes, UA: NEGATIVE
Nitrite: NEGATIVE
Specific Gravity, Urine: 1.015 (ref 1.000–1.030)
TOTAL PROTEIN, URINE-UPE24: NEGATIVE
UROBILINOGEN UA: 0.2 (ref 0.0–1.0)
Urine Glucose: NEGATIVE
pH: 7 (ref 5.0–8.0)

## 2013-05-08 LAB — PSA: PSA: 0.56 ng/mL (ref 0.10–4.00)

## 2013-05-08 LAB — BASIC METABOLIC PANEL
BUN: 19 mg/dL (ref 6–23)
CALCIUM: 9 mg/dL (ref 8.4–10.5)
CO2: 28 mEq/L (ref 19–32)
CREATININE: 1 mg/dL (ref 0.4–1.5)
Chloride: 105 mEq/L (ref 96–112)
GFR: 87.26 mL/min (ref >60.00)
Glucose, Bld: 89 mg/dL (ref 70–99)
Potassium: 4.4 mEq/L (ref 3.5–5.1)
Sodium: 139 mEq/L (ref 135–145)

## 2013-05-08 LAB — TSH: TSH: 2.81 u[IU]/mL (ref 0.35–5.50)

## 2013-05-08 MED ORDER — HYDROCODONE-ACETAMINOPHEN 10-325 MG PO TABS: 1.0000 | ORAL_TABLET | Freq: Four times a day (QID) | ORAL | Status: DC | PRN

## 2013-05-08 MED ORDER — HYDROCHLOROTHIAZIDE 12.5 MG PO CAPS: 12.5000 mg | ORAL_CAPSULE | Freq: Every day | ORAL | Status: DC

## 2013-05-08 MED ORDER — ATORVASTATIN CALCIUM 20 MG PO TABS: 20.0000 mg | ORAL_TABLET | Freq: Every day | ORAL | Status: AC

## 2013-05-08 MED ORDER — OMEPRAZOLE 20 MG PO CPDR: DELAYED_RELEASE_CAPSULE | ORAL | Status: AC

## 2013-05-08 MED ORDER — HYDROCODONE-ACETAMINOPHEN 10-325 MG PO TABS: 1.0000 | ORAL_TABLET | Freq: Four times a day (QID) | ORAL | Status: AC | PRN

## 2013-05-08 NOTE — Progress Notes (Signed)
Subjective:    Patient ID: Barry Carter, male    DOB: [birth year], 58 y.o.   MRN: [id number]  HPI  Here for wellness and f/u;  Overall doing ok;  Pt denies CP, worsening SOB, DOE, wheezing, orthopnea, PND, worsening LE edema, palpitations, dizziness or syncope.  Pt denies neurological change such as new headache, facial or extremity weakness.  Pt denies polydipsia, polyuria, or low sugar symptoms. Pt states overall good compliance with treatment and medications, good tolerability, and has been trying to follow lower cholesterol diet.  Pt denies worsening depressive symptoms, suicidal ideation or panic. No fever, night sweats, wt loss, loss of appetite, or other constitutional symptoms.  Pt states good ability with ADL's, has low fall risk, home safety reviewed and adequate, no other significant changes in hearing or vision, and only occasionally active with exercise. Chronic knee pain stable, needs med refills, knows he will need a new pain management after that.  Also looking into neuropsych eval in [place] where he lives to look into this, as well as the colonoscpy Past Medical History  Diagnosis Date  . ALLERGIC RHINITIS 12/30/2006  . CHEST PAIN 09/11/2007  . GERD 12/30/2006  . HEMORRHOIDS 12/28/2006  . HYPERLIPIDEMIA 12/30/2006  . LOW BACK PAIN 12/30/2006  . OBESITY 12/28/2006  . OSTEOARTHRITIS 12/30/2006   Past Surgical History  Procedure Laterality Date  . Vasectomy    . S/p right knee surgery      reports that he has never smoked. He does not have any smokeless tobacco history on file. He reports that he does not drink alcohol or use illicit drugs. family history includes Diabetes in his other; Heart disease in his other; Hypertension in his other. No Known Allergies Current Outpatient Prescriptions on File Prior to Visit  Medication Sig Dispense Refill  . aspirin 81 MG tablet Take 81 mg by mouth daily.         No current facility-administered medications on file prior to visit.  BP at  home usually < 140/90.    Only taking lipitor 20 mg (instead of 80 m) due to myalgias and trying to work on diet.   Review of Systems Constitutional: Negative for diaphoresis, activity change, appetite change or unexpected weight change.  HENT: Negative for hearing loss, ear pain, facial swelling, mouth sores and neck stiffness.   Eyes: Negative for pain, redness and visual disturbance.  Respiratory: Negative for shortness of breath and wheezing.   Cardiovascular: Negative for chest pain and palpitations.  Gastrointestinal: Negative for diarrhea, blood in stool, abdominal distention or other pain Genitourinary: Negative for hematuria, flank pain or change in urine volume.  Musculoskeletal: Negative for myalgias and joint swelling.  Skin: Negative for color change and wound.  Neurological: Negative for syncope and numbness. other than noted Hematological: Negative for adenopathy.  Psychiatric/Behavioral: Negative for hallucinations, self-injury, decreased concentration and agitation.      Objective:   Physical Exam BP 148/88  Pulse 78  Temp(Src) 98.2 F (36.8 C) (Oral)  Ht 6\' 6"  (1.981 m)  Wt 269 lb (122.018 kg)  BMI 31.09 kg/m2 VS noted,  Constitutional: Pt is oriented to person, place, and time. Appears well-developed and well-nourished.  Head: Normocephalic and atraumatic.  Right Ear: External ear normal.  Left Ear: External ear normal.  Nose: Nose normal.  Mouth/Throat: Oropharynx is clear and moist.  Eyes: Conjunctivae and EOM are normal. Pupils are equal, round, and reactive to light.  Neck: Normal range of motion. Neck supple. No JVD present.  No tracheal deviation present.  Cardiovascular: Normal rate, regular rhythm, normal heart sounds and intact distal pulses.   Pulmonary/Chest: Effort normal and breath sounds normal.  Abdominal: Soft. Bowel sounds are normal. There is no tenderness. No HSM  Musculoskeletal: Normal range of motion. Exhibits no edema Has bilat knee  crepitus, small effusion, decr ROM, NT.  Lymphadenopathy:  Has no cervical adenopathy.  Neurological: Pt is alert and oriented to person, place, and time. Pt has normal reflexes. No cranial nerve deficit.  Skin: Skin is warm and dry. No rash noted.  Psychiatric:  Has  normal mood and affect. Behavior is normal.     Assessment & Plan:

## 2013-05-08 NOTE — Progress Notes (Signed)
Pre visit review using our clinic review tool, if applicable. No additional management support is needed unless otherwise documented below in the visit note. 

## 2013-05-08 NOTE — Assessment & Plan Note (Signed)
Ok for lipitor 20 mg, for lower chol diet

## 2013-05-08 NOTE — Assessment & Plan Note (Signed)
stable overall by history and exam,  and pt to continue medical treatment as before,  to f/u any worsening symptoms or concerns, med refills done

## 2013-05-08 NOTE — Assessment & Plan Note (Signed)

## 2013-05-08 NOTE — Patient Instructions (Addendum)
You had the flu shot today Please continue all other medications as before, and refills have been done if requested. Please have the pharmacy call with any other refills you may need. You have been given the letter previously explaining the transitional pain medication refill policy Please be aware that I will no longer be able to offer monthly refills of any Schedule II or higher medication starting May 31, 2013 Please continue your efforts at being more active, low cholesterol diet, and weight control. You are otherwise up to date with prevention measures today. Please contact a gastroenterologist in [place] to schedule a screening colonoscopy  Please keep your appointments with your specialists as you may have planned  Please remember to sign up for My Chart if you have not done so, as this will be important to you in the future with finding out test results, communicating by private email, and scheduling acute appointments online when needed.  Please return in 1 year for your yearly visit, or sooner if needed, with Lab testing done 3-5 days before

## 2013-05-09 ENCOUNTER — Other Ambulatory Visit: Payer: Self-pay | Admitting: Internal Medicine

## 2014-02-12 ENCOUNTER — Other Ambulatory Visit: Payer: Self-pay
